# Patient Record
Sex: Male | Born: 1960
Health system: Southern US, Community
[De-identification: ages and names within clinical notes are randomized; demographics above are authoritative.]

## PROBLEM LIST (undated history)

## (undated) DIAGNOSIS — E785 Hyperlipidemia, unspecified: Secondary | ICD-10-CM

## (undated) DIAGNOSIS — L0390 Cellulitis, unspecified: Secondary | ICD-10-CM

## (undated) DIAGNOSIS — I10 Essential (primary) hypertension: Secondary | ICD-10-CM

## (undated) DIAGNOSIS — R531 Weakness: Secondary | ICD-10-CM

## (undated) DIAGNOSIS — M542 Cervicalgia: Secondary | ICD-10-CM

## (undated) DIAGNOSIS — E119 Type 2 diabetes mellitus without complications: Secondary | ICD-10-CM

## (undated) HISTORY — DX: Essential (primary) hypertension: I10

## (undated) HISTORY — DX: Cellulitis, unspecified: L03.90

## (undated) HISTORY — PX: NECK SURGERY: SHX720

## (undated) HISTORY — PX: COLONOSCOPY: SHX174

---

## 2011-07-08 ENCOUNTER — Ambulatory Visit (INDEPENDENT_AMBULATORY_CARE_PROVIDER_SITE_OTHER): Payer: BC Managed Care – PPO

## 2011-07-08 DIAGNOSIS — L219 Seborrheic dermatitis, unspecified: Secondary | ICD-10-CM

## 2011-07-16 ENCOUNTER — Ambulatory Visit (INDEPENDENT_AMBULATORY_CARE_PROVIDER_SITE_OTHER): Payer: BC Managed Care – PPO

## 2011-07-16 DIAGNOSIS — Z0000 Encounter for general adult medical examination without abnormal findings: Secondary | ICD-10-CM

## 2011-07-27 ENCOUNTER — Telehealth: Payer: Self-pay

## 2011-07-27 NOTE — Telephone Encounter (Signed)
Pt's wife reports that the cyst on the pts neck is starting to fill back up, getting larger again and feeling sore. It had greatly improved after round of Doxy, which is when he was in for CPE, but now that he has been off of it for awhile, infection is returning. She said the MD had originally said that pt may have to be on Abx for extended time, and requests another round of Abx.

## 2011-07-27 NOTE — Telephone Encounter (Signed)
.  [facility] CRYSTAL STATES HER HUSBAND HAVE BEEN SEEN SEVERAL TIMES FOR THE SAME THING AND WE NEED TO CALL IN SOME MORE ANTIBOTICS BECAUSE THEY DON'T WANT TO COME BACK FOR THE SAME THING. PLEASE CALL [phone] AND THEY USE [facility] ON [address]

## 2011-07-28 ENCOUNTER — Telehealth: Payer: Self-pay

## 2011-07-28 NOTE — Telephone Encounter (Signed)
Spoke with pt and advised i was calling in doxy #[phone]. He wants to know if we could call in at least 2 months worth. Please advise

## 2011-07-29 MED ORDER — DOXYCYCLINE HYCLATE 100 MG PO TABS: 100.0000 mg | ORAL_TABLET | Freq: Two times a day (BID) | ORAL | Status: AC

## 2011-07-29 NOTE — Telephone Encounter (Signed)
yes

## 2011-07-29 NOTE — Telephone Encounter (Signed)
LMOM notifying pt that supply of Doxy was called in.

## 2011-10-05 ENCOUNTER — Encounter: Payer: Self-pay | Admitting: Gastroenterology

## 2012-04-13 ENCOUNTER — Telehealth: Payer: Self-pay

## 2012-04-13 NOTE — Telephone Encounter (Signed)
Advised pt wife that pt must come in to be seen

## 2012-04-13 NOTE — Telephone Encounter (Signed)
Patient has infected boil on head again. Patient is "woozy" and "dizzy". Boil is on right side of head in his hair. Patient's wife is requesting that an antibiotic be called in because he has already been seen for this and she feels that "the ball was dropped" on our end. Patient leaves town tomorrow, 10/20, and his wife would like to be contacted asap. She can be reached at [phone].

## 2012-04-13 NOTE — Telephone Encounter (Signed)
Sorry we do not call in antibiotics for that. Patient would need to be evaluated.

## 2012-04-14 ENCOUNTER — Telehealth: Payer: Self-pay

## 2012-04-14 ENCOUNTER — Ambulatory Visit (INDEPENDENT_AMBULATORY_CARE_PROVIDER_SITE_OTHER): Payer: BC Managed Care – PPO | Admitting: Family Medicine

## 2012-04-14 VITALS — BP 120/76 | HR 106 | Temp 97.9°F | Resp 17 | Ht 73.0 in | Wt 172.0 lb

## 2012-04-14 DIAGNOSIS — R5381 Other malaise: Secondary | ICD-10-CM

## 2012-04-14 DIAGNOSIS — L03811 Cellulitis of head [any part, except face]: Secondary | ICD-10-CM

## 2012-04-14 DIAGNOSIS — L02831 Carbuncle of head [any part, except face]: Secondary | ICD-10-CM

## 2012-04-14 DIAGNOSIS — R42 Dizziness and giddiness: Secondary | ICD-10-CM

## 2012-04-14 DIAGNOSIS — R5383 Other fatigue: Secondary | ICD-10-CM

## 2012-04-14 DIAGNOSIS — L02828 Furuncle of other sites: Secondary | ICD-10-CM

## 2012-04-14 LAB — BASIC METABOLIC PANEL
BUN: 6 mg/dL (ref 6–23)
CO2: 27 mEq/L (ref 19–32)
Calcium: 9.4 mg/dL (ref 8.4–10.5)
Creat: 0.65 mg/dL (ref 0.50–1.35)
Glucose, Bld: 93 mg/dL (ref 70–99)
Sodium: 138 mEq/L (ref 135–145)

## 2012-04-14 LAB — POCT CBC
HCT, POC: 48 % (ref 43.5–53.7)
Hemoglobin: 15 g/dL (ref 14.1–18.1)
Lymph, poc: 2.3 (ref 0.6–3.4)
MCH, POC: 29.5 pg (ref 27–31.2)
MCHC: 31.3 g/dL — AB (ref 31.8–35.4)
MCV: 94.4 fL (ref 80–97)
POC Granulocyte: 3.7 (ref 2–6.9)
POC LYMPH PERCENT: 36.1 %L (ref 10–50)
RDW, POC: 14.3 %
WBC: 6.5 10*3/uL (ref 4.6–10.2)

## 2012-04-14 MED ORDER — MUPIROCIN CALCIUM 2 % NA OINT: TOPICAL_OINTMENT | Freq: Two times a day (BID) | NASAL | Status: DC

## 2012-04-14 MED ORDER — DOXYCYCLINE HYCLATE 100 MG PO TABS: 100.0000 mg | ORAL_TABLET | Freq: Two times a day (BID) | ORAL | Status: DC

## 2012-04-14 NOTE — Progress Notes (Signed)
   Patient ID: Malakii Harl MRN: [id number], DOB: 1961/06/03, 51 y.o. Date of Encounter: 04/14/2012, 10:50 AM    PROCEDURE NOTE: Verbal consent obtained. Betadine prep per usual protocol. Local anesthesia obtained with 1% lidocaine with epi 0.5 cc for local anesthesia of lesion along temporal scalp.   0.75 cm incision made with 11 blade along lesion.  Culture taken. Purulence expressed. Lesion explored revealing no loculations. Dressed. Wound care instructions including precautions with patient. Patient tolerated the procedure well.    Signed, Eula Listen, PA-C 04/14/2012 10:50 AM

## 2012-04-14 NOTE — Patient Instructions (Addendum)
Take antibiotic as prescribed and nasal ointment.  recheck if not improving or recurs - may need another eval with dermatology or surgeon.  Recheck in next 1 week for fatigue - sooner if worse, including any fever. Your should receive a call or letter about your lab results within the next week to 10 days.

## 2012-04-14 NOTE — Addendum Note (Signed)
Addended by: Neva Seat, Angelika Jerrett R on: 04/14/2012 11:10 AM   Modules accepted: Orders

## 2012-04-14 NOTE — Telephone Encounter (Signed)
Patient wife states he was seen today by Dr. Neva Seat and given Rx for skin ointment to be placed in the nose. They have concerns about the medication since the pharmacy changed the Rx from what the Dr ordered. The Dr ordered the nasal ointment  But they were given the original topical ointment and they need to know if its ok for that to be put in the nose because of the warnings on the medication. The patient had corneal surgery in the past so they are concerned. 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 Best # [phone].

## 2012-04-14 NOTE — Progress Notes (Signed)
Subjective:    Patient ID: Donald Rowland, male    DOB: 1960/08/08, 51 y.o.   MRN: [id number]  HPI Donald Rowland is a 51 y.o. male  Bumps on side of head and neck for few years, one is more sore today.  One of the other ones opened up last week and drained on its own. Feeling sluggish, weak for about a week. , dizzy, lightheaded past few days. No chest pains, no dark/tarry stools.  No fevers.   Hx of recurrent problems with boils - prior on doxycyline 100mg  bid for few months, last taken about a month ago.    Hx cornea transplant in 2010 - bumps since then.  Saw dermatologist for this in 2011 - told to see surgeon, but never had referral the, has not seen surgeon.   Review of Systems  Constitutional: Positive for fatigue. Negative for fever and chills.  Respiratory: Negative for chest tightness and shortness of breath.   Cardiovascular: Negative for chest pain and palpitations.  Gastrointestinal: Negative for blood in stool and anal bleeding.  Skin: Positive for rash.  Neurological: Positive for dizziness and light-headedness.       Objective:   Physical Exam  Constitutional: He is oriented to person, place, and time. He appears well-developed and well-nourished.  HENT:  Head: Atraumatic.    Right Ear: External ear normal.  Left Ear: External ear normal.  Eyes: EOM are normal. Pupils are equal, round, and reactive to light.  Cardiovascular: Normal rate, regular rhythm, normal heart sounds and intact distal pulses.   No extrasystoles are present.  No murmur heard. Pulmonary/Chest: Effort normal and breath sounds normal.  Neurological: He is alert and oriented to person, place, and time.  Skin: Skin is warm and dry.  Psychiatric: He has a normal mood and affect. His behavior is normal.     Results for orders placed in visit on 04/14/12  POCT CBC      Component Value Range   WBC 6.5  4.6 - 10.2 K/uL   Lymph, poc 2.3  0.6 - 3.4   POC LYMPH PERCENT 36.1  10 - 50 %L   MID  (cbc) 0.4  0 - 0.9   POC MID % 6.7  0 - 12 %M   POC Granulocyte 3.7  2 - 6.9   Granulocyte percent 57.2  37 - 80 %G   RBC 5.08  4.69 - 6.13 M/uL   Hemoglobin 15.0  14.1 - 18.1 g/dL   HCT, POC 11.9  14.7 - 53.7 %   MCV 94.4  80 - 97 fL   MCH, POC 29.5  27 - 31.2 pg   MCHC 31.3 (*) 31.8 - 35.4 g/dL   RDW, POC 82.9     Platelet Count, POC 134 (*) 142 - 424 K/uL   MPV 11.1  0 - 99.8 fL        Assessment & Plan:  Mohd. Derflinger is a 51 y.o. male . 1. Dizziness  POCT CBC, Basic metabolic panel  2. Malaise and fatigue  POCT CBC, Basic metabolic panel  3. Carbuncle of scalp  POCT CBC, Basic metabolic panel  4. Abscess or cellulitis of scalp  doxycycline (VIBRA-TABS) 100 MG tablet, mupirocin nasal ointment (BACTROBAN NASAL) 2 %    Carbuncle scalp - cellulitis - incised per procedure note. cx pending. Recurrent prior, with cx MSSA.  Doxycycline BID, and retreat with bactroban for possible MRSA.   Fatigue/malaise/dizziness. Cbc wnl, bmp wnl, fluids, relative rest and recheck in  next week if not improved, - rtc/er precautions discussed.  Patient Instructions  Take antibiotic as prescribed and nasal ointment.  recheck if not improving or recurs - may need another eval with dermatology or surgeon.  Recheck in next 1 week for fatigue - sooner if worse, including any fever. Your should receive a call or letter about your lab results within the next week to 10 days.

## 2012-04-15 NOTE — Telephone Encounter (Signed)
Is this ok?

## 2012-04-15 NOTE — Telephone Encounter (Signed)
Called patient he is advised.

## 2012-04-15 NOTE — Telephone Encounter (Signed)
That is fine 

## 2012-04-17 LAB — WOUND CULTURE: Gram Stain: NONE SEEN

## 2013-03-11 DIAGNOSIS — Z87891 Personal history of nicotine dependence: Secondary | ICD-10-CM | POA: Insufficient documentation

## 2013-04-07 DIAGNOSIS — Z947 Corneal transplant status: Secondary | ICD-10-CM | POA: Insufficient documentation

## 2013-04-28 LAB — HM COLONOSCOPY

## 2013-05-01 DIAGNOSIS — Z86010 Personal history of colonic polyps: Secondary | ICD-10-CM | POA: Insufficient documentation

## 2013-05-01 DIAGNOSIS — Z860101 Personal history of adenomatous and serrated colon polyps: Secondary | ICD-10-CM | POA: Insufficient documentation

## 2014-09-14 NOTE — Pre-Procedure Instructions (Signed)
Jeannine Bogaarnest Bayles  09/14/2014   Your procedure is scheduled on:  Thurs, Mar 31 @ 8:30 AM  [facility]  at 6:30 AM.  Call this number if you have problems the morning of surgery: [phone]   Remember:   Do not eat food or drink liquids after midnight.                 No Goody's,BC's,Aleve,Aspirin,Ibuprofen,Fish Oil,or any Herbal Medications.    Do not wear jewelry.  Do not wear lotions, powders, or colognes. You may wear deodorant.  Men may shave face and neck.  Do not bring valuables to the hospital.  [facility] is not responsible                  for any belongings or valuables.               Contacts, dentures or bridgework may not be worn into surgery.  Leave suitcase in the car. After surgery it may be brought to your room.  For patients admitted to the hospital, discharge time is determined by your                treatment team.               Patients discharged the day of surgery will not be allowed to drive  home.    Special Instructions:  [facility] - Preparing for Surgery  Before surgery, you can play an important role.  Because skin is not sterile, your skin needs to be as free of germs as possible.  You can reduce the number of germs on you skin by washing with CHG (chlorahexidine gluconate) soap before surgery.  CHG is an antiseptic cleaner which kills germs and bonds with the skin to continue killing germs even after washing.  Please DO NOT use if you have an allergy to CHG or antibacterial soaps.  If your skin becomes reddened/irritated stop using the CHG and inform your nurse when you arrive at Short Stay.  Do not shave (including legs and underarms) for at least 48 hours prior to the first CHG shower.  You may shave your face.  Please follow these instructions carefully:   1.  Shower with CHG Soap the night before surgery and the                                morning of Surgery.  2.  If you choose to wash your hair, wash your hair first as  usual with your       normal shampoo.  3.  After you shampoo, rinse your hair and body thoroughly to remove the                      Shampoo.  4.  Use CHG as you would any other liquid soap.  You can apply chg directly       to the skin and wash gently with scrungie or a clean washcloth.  5.  Apply the CHG Soap to your body ONLY FROM THE NECK DOWN.        Do not use on open wounds or open sores.  Avoid contact with your eyes,       ears, mouth and genitals (private parts).  Wash genitals (private parts)       with your normal soap.  6.  Wash thoroughly, paying  special attention to the area where your surgery        will be performed.  7.  Thoroughly rinse your body with warm water from the neck down.  8.  DO NOT shower/wash with your normal soap after using and rinsing off       the CHG Soap.  9.  Pat yourself dry with a clean towel.            10.  Wear clean pajamas.            11.  Place clean sheets on your bed the night of your first shower and do not        sleep with pets.  Day of Surgery  Do not apply any lotions/deoderants the morning of surgery.  Please wear clean clothes to the hospital/surgery center.     Please read over the following fact sheets that you were given: Pain Booklet, Coughing and Deep Breathing, MRSA Information and Surgical Site Infection Prevention

## 2014-09-15 ENCOUNTER — Encounter (HOSPITAL_COMMUNITY)
Admission: RE | Admit: 2014-09-15 | Discharge: 2014-09-15 | Disposition: A | Discharge status: Home / Self Care | Payer: Worker's Compensation | Source: Ambulatory Visit | Attending: Orthopedic Surgery | Admitting: Orthopedic Surgery

## 2014-09-15 ENCOUNTER — Encounter (HOSPITAL_COMMUNITY): Payer: Self-pay

## 2014-09-15 DIAGNOSIS — G959 Disease of spinal cord, unspecified: Secondary | ICD-10-CM | POA: Diagnosis not present

## 2014-09-15 DIAGNOSIS — M4802 Spinal stenosis, cervical region: Secondary | ICD-10-CM | POA: Insufficient documentation

## 2014-09-15 DIAGNOSIS — Z01812 Encounter for preprocedural laboratory examination: Secondary | ICD-10-CM | POA: Diagnosis present

## 2014-09-15 HISTORY — DX: Weakness: R53.1

## 2014-09-15 HISTORY — DX: Cervicalgia: M54.2

## 2014-09-15 LAB — BASIC METABOLIC PANEL
Anion gap: 9 (ref 5–15)
BUN: 6 mg/dL (ref 6–23)
CO2: 25 mmol/L (ref 19–32)
Calcium: 9.6 mg/dL (ref 8.4–10.5)
Chloride: 103 mmol/L (ref 96–112)
Creatinine, Ser: 0.91 mg/dL (ref 0.50–1.35)
GFR calc Af Amer: >90 mL/min (ref >90)
GLUCOSE: 111 mg/dL — AB (ref 70–99)
POTASSIUM: 3.7 mmol/L (ref 3.5–5.1)
Sodium: 137 mmol/L (ref 135–145)

## 2014-09-15 LAB — SURGICAL PCR SCREEN
MRSA, PCR: NEGATIVE
Staphylococcus aureus: NEGATIVE

## 2014-09-15 NOTE — Progress Notes (Signed)
Pt doesn't have a cardiologist  Dr.Painter is medical md  Denies EKG or CXR in past yr  Denies ever having an echo/stress test/heart cath

## 2014-09-19 DIAGNOSIS — J449 Chronic obstructive pulmonary disease, unspecified: Secondary | ICD-10-CM | POA: Insufficient documentation

## 2014-09-21 DIAGNOSIS — E785 Hyperlipidemia, unspecified: Secondary | ICD-10-CM | POA: Insufficient documentation

## 2014-09-22 MED ORDER — CEFAZOLIN SODIUM-DEXTROSE 2-3 GM-% IV SOLR
2.0000 g | INTRAVENOUS | Status: AC
Start: 2014-09-22 — End: 2014-09-23
  Administered 2014-09-23 (×2): 2 g via INTRAVENOUS
  Filled 2014-09-22: qty 50

## 2014-09-23 ENCOUNTER — Encounter (HOSPITAL_COMMUNITY)
Admission: RE | Disposition: A | Discharge status: Home / Self Care | Payer: Self-pay | Source: Ambulatory Visit | Attending: Orthopedic Surgery

## 2014-09-23 ENCOUNTER — Encounter (HOSPITAL_COMMUNITY): Payer: Self-pay | Admitting: *Deleted

## 2014-09-23 ENCOUNTER — Ambulatory Visit (HOSPITAL_COMMUNITY): Payer: Worker's Compensation | Admitting: Anesthesiology

## 2014-09-23 ENCOUNTER — Observation Stay (HOSPITAL_COMMUNITY)
Admission: RE | Admit: 2014-09-23 | Discharge: 2014-09-24 | Disposition: A | Discharge status: Home / Self Care | Payer: Worker's Compensation | Source: Ambulatory Visit | Attending: Orthopedic Surgery | Admitting: Orthopedic Surgery

## 2014-09-23 ENCOUNTER — Observation Stay (HOSPITAL_COMMUNITY): Payer: Worker's Compensation

## 2014-09-23 ENCOUNTER — Ambulatory Visit (HOSPITAL_COMMUNITY): Payer: Worker's Compensation

## 2014-09-23 DIAGNOSIS — M4712 Other spondylosis with myelopathy, cervical region: Secondary | ICD-10-CM | POA: Diagnosis not present

## 2014-09-23 DIAGNOSIS — F17210 Nicotine dependence, cigarettes, uncomplicated: Secondary | ICD-10-CM | POA: Diagnosis not present

## 2014-09-23 DIAGNOSIS — M542 Cervicalgia: Secondary | ICD-10-CM | POA: Diagnosis present

## 2014-09-23 DIAGNOSIS — Z981 Arthrodesis status: Secondary | ICD-10-CM

## 2014-09-23 DIAGNOSIS — G8929 Other chronic pain: Secondary | ICD-10-CM | POA: Diagnosis present

## 2014-09-23 DIAGNOSIS — Z419 Encounter for procedure for purposes other than remedying health state, unspecified: Secondary | ICD-10-CM

## 2014-09-23 HISTORY — PX: ANTERIOR CERVICAL DECOMP/DISCECTOMY FUSION: SHX1161

## 2014-09-23 SURGERY — ANTERIOR CERVICAL DECOMPRESSION/DISCECTOMY FUSION 1 LEVEL
Anesthesia: General | Site: Neck

## 2014-09-23 MED ORDER — DOCUSATE SODIUM 100 MG PO CAPS: 100.0000 mg | ORAL_CAPSULE | Freq: Three times a day (TID) | ORAL | Status: DC | PRN

## 2014-09-23 MED ORDER — ROCURONIUM BROMIDE 50 MG/5ML IV SOLN
INTRAVENOUS | Status: AC
  Filled 2014-09-23: qty 1

## 2014-09-23 MED ORDER — GLYCOPYRROLATE 0.2 MG/ML IJ SOLN
INTRAMUSCULAR | Status: AC
  Filled 2014-09-23: qty 4

## 2014-09-23 MED ORDER — ONDANSETRON HCL 4 MG/2ML IJ SOLN
INTRAMUSCULAR | Status: AC
  Filled 2014-09-23: qty 2

## 2014-09-23 MED ORDER — DEXTROSE 5 % IV SOLN
INTRAVENOUS | Status: DC | PRN
  Administered 2014-09-23 (×2): via INTRAVENOUS

## 2014-09-23 MED ORDER — MORPHINE SULFATE 2 MG/ML IJ SOLN: 1.0000 mg | INTRAMUSCULAR | Status: DC | PRN

## 2014-09-23 MED ORDER — NEOSTIGMINE METHYLSULFATE 10 MG/10ML IV SOLN
INTRAVENOUS | Status: DC | PRN
  Administered 2014-09-23 (×2): 2 mg via INTRAVENOUS

## 2014-09-23 MED ORDER — CEFAZOLIN SODIUM 1-5 GM-% IV SOLN
1.0000 g | Freq: Three times a day (TID) | INTRAVENOUS | Status: AC
  Administered 2014-09-23 – 2014-09-24 (×2): 1 g via INTRAVENOUS
  Filled 2014-09-23 (×3): qty 50

## 2014-09-23 MED ORDER — NEOSTIGMINE METHYLSULFATE 10 MG/10ML IV SOLN
INTRAVENOUS | Status: AC
  Filled 2014-09-23: qty 1

## 2014-09-23 MED ORDER — ACETAMINOPHEN 10 MG/ML IV SOLN
1000.0000 mg | Freq: Four times a day (QID) | INTRAVENOUS | Status: AC
  Administered 2014-09-23 – 2014-09-24 (×3): 1000 mg via INTRAVENOUS
  Filled 2014-09-23 (×3): qty 100

## 2014-09-23 MED ORDER — OXYCODONE HCL 5 MG/5ML PO SOLN: 5.0000 mg | Freq: Once | ORAL | Status: AC | PRN

## 2014-09-23 MED ORDER — ONDANSETRON HCL 4 MG PO TABS: 4.0000 mg | ORAL_TABLET | Freq: Three times a day (TID) | ORAL | Status: DC | PRN

## 2014-09-23 MED ORDER — FENTANYL CITRATE 0.05 MG/ML IJ SOLN
INTRAMUSCULAR | Status: DC | PRN
  Administered 2014-09-23 (×2): 100 ug via INTRAVENOUS
  Administered 2014-09-23 (×3): 50 ug via INTRAVENOUS

## 2014-09-23 MED ORDER — ARTIFICIAL TEARS OP OINT
TOPICAL_OINTMENT | OPHTHALMIC | Status: AC
  Filled 2014-09-23: qty 3.5

## 2014-09-23 MED ORDER — HEMOSTATIC AGENTS (NO CHARGE) OPTIME
TOPICAL | Status: DC | PRN
  Administered 2014-09-23: 1 via TOPICAL

## 2014-09-23 MED ORDER — MIDAZOLAM HCL 5 MG/5ML IJ SOLN
INTRAMUSCULAR | Status: DC | PRN
  Administered 2014-09-23 (×2): 1 mg via INTRAVENOUS

## 2014-09-23 MED ORDER — GLYCOPYRROLATE 0.2 MG/ML IJ SOLN
INTRAMUSCULAR | Status: DC | PRN
  Administered 2014-09-23 (×2): 0.3 mg via INTRAVENOUS

## 2014-09-23 MED ORDER — HYDROMORPHONE HCL 1 MG/ML IJ SOLN
0.2500 mg | INTRAMUSCULAR | Status: DC | PRN
  Administered 2014-09-23 (×2): 0.5 mg via INTRAVENOUS

## 2014-09-23 MED ORDER — PHENOL 1.4 % MT LIQD
1.0000 | OROMUCOSAL | Status: DC | PRN
Start: 2014-09-23 — End: 2014-09-24

## 2014-09-23 MED ORDER — DEXAMETHASONE SODIUM PHOSPHATE 10 MG/ML IJ SOLN
INTRAMUSCULAR | Status: AC
  Filled 2014-09-23: qty 1

## 2014-09-23 MED ORDER — OXYCODONE-ACETAMINOPHEN 10-325 MG PO TABS: 1.0000 | ORAL_TABLET | ORAL | Status: DC | PRN

## 2014-09-23 MED ORDER — ONDANSETRON HCL 4 MG/2ML IJ SOLN
INTRAMUSCULAR | Status: DC | PRN
  Administered 2014-09-23: 4 mg via INTRAVENOUS

## 2014-09-23 MED ORDER — DEXAMETHASONE 4 MG PO TABS
4.0000 mg | ORAL_TABLET | Freq: Four times a day (QID) | ORAL | Status: DC
  Administered 2014-09-23 – 2014-09-24 (×4): 4 mg via ORAL
  Filled 2014-09-23 (×4): qty 1

## 2014-09-23 MED ORDER — LACTATED RINGERS IV SOLN: INTRAVENOUS | Status: DC

## 2014-09-23 MED ORDER — SODIUM CHLORIDE 0.9 % IJ SOLN: 3.0000 mL | INTRAMUSCULAR | Status: DC | PRN

## 2014-09-23 MED ORDER — METHOCARBAMOL 500 MG PO TABS
500.0000 mg | ORAL_TABLET | Freq: Four times a day (QID) | ORAL | Status: DC | PRN
  Administered 2014-09-23 – 2014-09-24 (×2): 500 mg via ORAL
  Filled 2014-09-23 (×2): qty 1

## 2014-09-23 MED ORDER — ROCURONIUM BROMIDE 100 MG/10ML IV SOLN
INTRAVENOUS | Status: DC | PRN
  Administered 2014-09-23: 50 mg via INTRAVENOUS

## 2014-09-23 MED ORDER — LIDOCAINE HCL (CARDIAC) 20 MG/ML IV SOLN
INTRAVENOUS | Status: DC | PRN
  Administered 2014-09-23: 40 mg via INTRAVENOUS

## 2014-09-23 MED ORDER — MENTHOL 3 MG MT LOZG: 1.0000 | LOZENGE | OROMUCOSAL | Status: DC | PRN

## 2014-09-23 MED ORDER — PROPOFOL 10 MG/ML IV BOLUS
INTRAVENOUS | Status: AC
  Filled 2014-09-23: qty 20

## 2014-09-23 MED ORDER — OXYCODONE HCL 5 MG PO TABS
ORAL_TABLET | ORAL | Status: AC
  Filled 2014-09-23: qty 1

## 2014-09-23 MED ORDER — OXYCODONE HCL 5 MG PO TABS
5.0000 mg | ORAL_TABLET | Freq: Once | ORAL | Status: AC | PRN
  Administered 2014-09-23: 5 mg via ORAL

## 2014-09-23 MED ORDER — LACTATED RINGERS IV SOLN
INTRAVENOUS | Status: DC | PRN
  Administered 2014-09-23 (×3): via INTRAVENOUS

## 2014-09-23 MED ORDER — DEXAMETHASONE SODIUM PHOSPHATE 4 MG/ML IJ SOLN: 4.0000 mg | Freq: Four times a day (QID) | INTRAMUSCULAR | Status: DC

## 2014-09-23 MED ORDER — SODIUM CHLORIDE 0.9 % IJ SOLN: 3.0000 mL | Freq: Two times a day (BID) | INTRAMUSCULAR | Status: DC

## 2014-09-23 MED ORDER — OXYCODONE HCL 5 MG PO TABS
10.0000 mg | ORAL_TABLET | ORAL | Status: DC | PRN
  Administered 2014-09-23 – 2014-09-24 (×2): 10 mg via ORAL
  Filled 2014-09-23 (×2): qty 2

## 2014-09-23 MED ORDER — METHOCARBAMOL 500 MG PO TABS: 500.0000 mg | ORAL_TABLET | Freq: Three times a day (TID) | ORAL | Status: DC | PRN

## 2014-09-23 MED ORDER — HYDROMORPHONE HCL 1 MG/ML IJ SOLN
INTRAMUSCULAR | Status: AC
  Administered 2014-09-23: 0.5 mg via INTRAVENOUS
  Filled 2014-09-23: qty 1

## 2014-09-23 MED ORDER — BUPIVACAINE-EPINEPHRINE (PF) 0.25% -1:200000 IJ SOLN
INTRAMUSCULAR | Status: AC
  Filled 2014-09-23: qty 30

## 2014-09-23 MED ORDER — DEXTROSE 5 % IV SOLN
500.0000 mg | INTRAVENOUS | Status: AC
  Administered 2014-09-23: 500 mg via INTRAVENOUS
  Filled 2014-09-23: qty 5

## 2014-09-23 MED ORDER — PHENYLEPHRINE HCL 10 MG/ML IJ SOLN
10.0000 mg | INTRAVENOUS | Status: DC | PRN
  Administered 2014-09-23: 30 ug/min via INTRAVENOUS

## 2014-09-23 MED ORDER — ONDANSETRON HCL 4 MG/2ML IJ SOLN: 4.0000 mg | Freq: Once | INTRAMUSCULAR | Status: DC | PRN

## 2014-09-23 MED ORDER — DEXTROSE 5 % IV SOLN: 500.0000 mg | Freq: Four times a day (QID) | INTRAVENOUS | Status: DC | PRN

## 2014-09-23 MED ORDER — THROMBIN 20000 UNITS EX SOLR
CUTANEOUS | Status: DC | PRN
  Administered 2014-09-23: 20000 [IU] via TOPICAL

## 2014-09-23 MED ORDER — OXYCODONE HCL 5 MG PO TABS
ORAL_TABLET | ORAL | Status: AC
  Administered 2014-09-23: 5 mg via ORAL
  Filled 2014-09-23: qty 2

## 2014-09-23 MED ORDER — LIDOCAINE HCL (CARDIAC) 20 MG/ML IV SOLN
INTRAVENOUS | Status: AC
  Filled 2014-09-23: qty 5

## 2014-09-23 MED ORDER — 0.9 % SODIUM CHLORIDE (POUR BTL) OPTIME
TOPICAL | Status: DC | PRN
  Administered 2014-09-23: 1000 mL

## 2014-09-23 MED ORDER — ACETAMINOPHEN 10 MG/ML IV SOLN
1000.0000 mg | INTRAVENOUS | Status: AC
  Administered 2014-09-23: 1000 mg via INTRAVENOUS
  Filled 2014-09-23: qty 100

## 2014-09-23 MED ORDER — MIDAZOLAM HCL 2 MG/2ML IJ SOLN
INTRAMUSCULAR | Status: AC
  Filled 2014-09-23: qty 2

## 2014-09-23 MED ORDER — THROMBIN 20000 UNITS EX SOLR
CUTANEOUS | Status: AC
  Filled 2014-09-23: qty 20000

## 2014-09-23 MED ORDER — SODIUM CHLORIDE 0.9 % IV SOLN: 250.0000 mL | INTRAVENOUS | Status: DC

## 2014-09-23 MED ORDER — DEXAMETHASONE SODIUM PHOSPHATE 10 MG/ML IJ SOLN
INTRAMUSCULAR | Status: DC | PRN
  Administered 2014-09-23: 10 mg via INTRAVENOUS

## 2014-09-23 MED ORDER — FENTANYL CITRATE 0.05 MG/ML IJ SOLN
INTRAMUSCULAR | Status: AC
  Filled 2014-09-23: qty 5

## 2014-09-23 MED ORDER — BUPIVACAINE-EPINEPHRINE 0.25% -1:200000 IJ SOLN
INTRAMUSCULAR | Status: DC | PRN
  Administered 2014-09-23: 3 mL

## 2014-09-23 MED ORDER — ONDANSETRON HCL 4 MG/2ML IJ SOLN: 4.0000 mg | INTRAMUSCULAR | Status: DC | PRN

## 2014-09-23 MED ORDER — PROPOFOL 10 MG/ML IV BOLUS
INTRAVENOUS | Status: DC | PRN
  Administered 2014-09-23: 190 mg via INTRAVENOUS
  Administered 2014-09-23: 10 mg via INTRAVENOUS

## 2014-09-23 SURGICAL SUPPLY — 61 items
BIT DRILL 16 (BIT) ×2 IMPLANT
BLADE SURG ROTATE 9660 (MISCELLANEOUS) IMPLANT
BUR EGG ELITE 4.0 (BURR) IMPLANT
BUR MATCHSTICK NEURO 3.0 LAGG (BURR) IMPLANT
BUR MATCHSTICK NEURO 3.0X3.8 (BURR) ×2 IMPLANT
CANISTER SUCTION 2500CC (MISCELLANEOUS) ×2 IMPLANT
CLSR STERI-STRIP ANTIMIC 1/2X4 (GAUZE/BANDAGES/DRESSINGS) ×2 IMPLANT
CORDS BIPOLAR (ELECTRODE) ×2 IMPLANT
COVER SURGICAL LIGHT HANDLE (MISCELLANEOUS) ×4 IMPLANT
CRADLE DONUT ADULT HEAD (MISCELLANEOUS) ×2 IMPLANT
DRAPE C-ARM 42X72 X-RAY (DRAPES) ×2 IMPLANT
DRAPE POUCH INSTRU U-SHP 10X18 (DRAPES) ×2 IMPLANT
DRAPE SURG 17X23 STRL (DRAPES) ×2 IMPLANT
DRAPE U-SHAPE 47X51 STRL (DRAPES) ×2 IMPLANT
DRSG MEPILEX BORDER 4X4 (GAUZE/BANDAGES/DRESSINGS) ×2 IMPLANT
DURAPREP 26ML APPLICATOR (WOUND CARE) ×2 IMPLANT
ELECT COATED BLADE 2.86 ST (ELECTRODE) ×2 IMPLANT
ELECT PENCIL ROCKER SW 15FT (MISCELLANEOUS) ×2 IMPLANT
ELECT REM PT RETURN 9FT ADLT (ELECTROSURGICAL) ×2
ELECTRODE REM PT RTRN 9FT ADLT (ELECTROSURGICAL) ×1 IMPLANT
GLOVE BIOGEL PI IND STRL 8 (GLOVE) ×1 IMPLANT
GLOVE BIOGEL PI IND STRL 8.5 (GLOVE) ×1 IMPLANT
GLOVE BIOGEL PI INDICATOR 8 (GLOVE) ×1
GLOVE BIOGEL PI INDICATOR 8.5 (GLOVE) ×1
GLOVE ORTHO TXT STRL SZ7.5 (GLOVE) ×2 IMPLANT
GLOVE SS BIOGEL STRL SZ 8.5 (GLOVE) ×1 IMPLANT
GLOVE SUPERSENSE BIOGEL SZ 8.5 (GLOVE) ×1
GOWN STRL REUS W/ TWL XL LVL3 (GOWN DISPOSABLE) ×1 IMPLANT
GOWN STRL REUS W/TWL 2XL LVL3 (GOWN DISPOSABLE) ×4 IMPLANT
GOWN STRL REUS W/TWL XL LVL3 (GOWN DISPOSABLE) ×1
IMPLANT PEEK LORDOTIC LRG 8MM (Peek) ×2 IMPLANT
KIT BASIN OR (CUSTOM PROCEDURE TRAY) ×2 IMPLANT
KIT ROOM TURNOVER OR (KITS) ×2 IMPLANT
NEEDLE SPNL 18GX3.5 QUINCKE PK (NEEDLE) ×2 IMPLANT
NS IRRIG 1000ML POUR BTL (IV SOLUTION) ×2 IMPLANT
PACK ORTHO CERVICAL (CUSTOM PROCEDURE TRAY) ×2 IMPLANT
PACK UNIVERSAL I (CUSTOM PROCEDURE TRAY) ×2 IMPLANT
PAD ARMBOARD 7.5X6 YLW CONV (MISCELLANEOUS) ×4 IMPLANT
PATTIES SURGICAL .25X.25 (GAUZE/BANDAGES/DRESSINGS) IMPLANT
PIN RETAINER PRODISC 14 MM (PIN) ×4 IMPLANT
PUTTY BONE DBX 2.5 MIS (Bone Implant) ×2 IMPLANT
RESTRAINT LIMB HOLDER UNIV (RESTRAINTS) ×2 IMPLANT
SCREW SELF DRILLING 16MM (Screw) ×2 IMPLANT
SCREW ST PVA ZERO 18MM (Screw) ×2 IMPLANT
SPONGE INTESTINAL PEANUT (DISPOSABLE) ×2 IMPLANT
SPONGE SURGIFOAM ABS GEL 100 (HEMOSTASIS) ×2 IMPLANT
SURGIFLO TRUKIT (HEMOSTASIS) IMPLANT
SURGIFLO W/THROMBIN 8M KIT (HEMOSTASIS) ×4 IMPLANT
SUT BONE WAX W31G (SUTURE) ×2 IMPLANT
SUT MON AB 3-0 SH 27 (SUTURE) ×1
SUT MON AB 3-0 SH27 (SUTURE) ×1 IMPLANT
SUT SILK 2 0 (SUTURE)
SUT SILK 2-0 18XBRD TIE 12 (SUTURE) IMPLANT
SUT VIC AB 2-0 CT1 18 (SUTURE) ×2 IMPLANT
SYR BULB IRRIGATION 50ML (SYRINGE) ×2 IMPLANT
SYR CONTROL 10ML LL (SYRINGE) ×2 IMPLANT
TAPE CLOTH 4X10 WHT NS (GAUZE/BANDAGES/DRESSINGS) ×2 IMPLANT
TAPE UMBILICAL COTTON 1/8X30 (MISCELLANEOUS) ×2 IMPLANT
TOWEL OR 17X24 6PK STRL BLUE (TOWEL DISPOSABLE) ×2 IMPLANT
TOWEL OR 17X26 10 PK STRL BLUE (TOWEL DISPOSABLE) ×2 IMPLANT
WATER STERILE IRR 1000ML POUR (IV SOLUTION) ×2 IMPLANT

## 2014-09-23 NOTE — Brief Op Note (Signed)
09/23/2014  12:56 PM  PATIENT:  Donald Rowland  54 y.o. male  PRE-OPERATIVE DIAGNOSIS:  CERVICAL MYELOPATHY AND STENOSIS  POST-OPERATIVE DIAGNOSIS:  CERVICAL MYELOPATHY AND STENOSIS  PROCEDURE:  Procedure(s): ANTERIOR CERVICAL DECOMPRESSION/DISCECTOMY FUSION C4-C5 (N/A)  Removal of hardware C3/4 C5/6 Exploration of fusion C3/4 C5/6  SURGEON:  Surgeon(s) and Role:    * Venita Lickahari Dardan Shelton, MD - Primary  PHYSICIAN ASSISTANT:   ASSISTANTS: none   ANESTHESIA:   general  EBL:  Total I/O In: 2500 [I.V.:2500] Out: 20 [Blood:20]  BLOOD ADMINISTERED:none  DRAINS: none   LOCAL MEDICATIONS USED:  MARCAINE     SPECIMEN:  No Specimen  DISPOSITION OF SPECIMEN:  N/A  COUNTS:  YES  TOURNIQUET:  * No tourniquets in log *  DICTATION: .Other Dictation: Dictation Number [id number]  PLAN OF CARE: Admit for overnight observation  PATIENT DISPOSITION:  PACU - hemodynamically stable.

## 2014-09-23 NOTE — Anesthesia Preprocedure Evaluation (Addendum)
Anesthesia Evaluation  Patient identified by MRN, date of birth, ID band Patient awake    Reviewed: Allergy & Precautions, NPO status , Patient's Chart, lab work & pertinent test results  Airway Mallampati: II  TM Distance: >3 FB Neck ROM: Full    Dental  (+) Teeth Intact, Dental Advisory Given   Pulmonary Current Smoker,  breath sounds clear to auscultation        Cardiovascular Rhythm:Regular Rate:Normal     Neuro/Psych    GI/Hepatic   Endo/Other    Renal/GU      Musculoskeletal   Abdominal   Peds  Hematology   Anesthesia Other Findings Beard  Reproductive/Obstetrics                          Anesthesia Physical Anesthesia Plan  ASA: II  Anesthesia Plan: General   Post-op Pain Management:    Induction: Intravenous  Airway Management Planned: Oral ETT  Additional Equipment:   Intra-op Plan:   Post-operative Plan: Extubation in OR  Informed Consent: I have reviewed the patients History and Physical, chart, labs and discussed the procedure including the risks, benefits and alternatives for the proposed anesthesia with the patient or authorized representative who has indicated his/her understanding and acceptance.   Dental advisory given  Plan Discussed with: CRNA and Anesthesiologist  Anesthesia Plan Comments:         Anesthesia Quick Evaluation

## 2014-09-23 NOTE — Transfer of Care (Deleted)
Immediate Anesthesia Transfer of Care Note  Patient: Donald Rowland  Procedure(s) Performed: Procedure(s): ANTERIOR CERVICAL DECOMPRESSION/DISCECTOMY FUSION C4-C5, EXPLORATION OF FUSION C3-C4 C5-C6, REMOVAL OF HARDWARE C3-C4 C4-C5 (N/A)  Patient Location: PACU  Anesthesia Type:General  Level of Consciousness: awake and patient cooperative  Airway & Oxygen Therapy: Patient Spontanous Breathing and Patient connected to nasal cannula oxygen  Post-op Assessment: Report given to RN, Post -op Vital signs reviewed and stable and Patient moving all extremities  Post vital signs: Reviewed and stable  Last Vitals:  Filed Vitals:   09/23/14 0650  BP: 144/100  Pulse:   Temp:   Resp:     Complications: No apparent anesthesia complications

## 2014-09-23 NOTE — Progress Notes (Signed)
Pt arrived to pacu from OR in bed.  Pt easily arousable with aspen collar on.  Pt able to squeeze my fingers with both hands with moderate strength.  He could also wiggle his toes on both feet.  He denies pain, SOB and nausea.  When asked about sensation, he stated he had some numbness in both arms about the same as before surgery.  Dr. Shon BatonBrooks at bedside to assess patient.
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
## 2014-09-23 NOTE — Transfer of Care (Signed)
Immediate Anesthesia Transfer of Care Note  Patient: Donald Rowland  Procedure(s) Performed: Procedure(s): ANTERIOR CERVICAL DECOMPRESSION/DISCECTOMY FUSION C4-C5, EXPLORATION OF FUSION C3-C4 C5-C6, REMOVAL OF HARDWARE C3-C4 C4-C5 (N/A)  Patient Location: PACU  Anesthesia Type:General  Level of Consciousness: awake and patient cooperative  Airway & Oxygen Therapy: Patient Spontanous Breathing and Patient connected to nasal cannula oxygen  Post-op Assessment: Report given to RN, Post -op Vital signs reviewed and stable and Patient moving all extremities  Post vital signs: Reviewed and stable  Last Vitals:  Filed Vitals:   09/23/14 0650  BP: 144/100  Pulse:   Temp:   Resp:     Complications: No apparent anesthesia complications 

## 2014-09-23 NOTE — Anesthesia Postprocedure Evaluation (Signed)
  Anesthesia Post-op Note  Patient: Donald Rowland  Procedure(s) Performed: Procedure(s): ANTERIOR CERVICAL DECOMPRESSION/DISCECTOMY FUSION C4-C5, EXPLORATION OF FUSION C3-C4 C5-C6, REMOVAL OF HARDWARE C3-C4 C4-C5 (N/A)  Patient Location: PACU  Anesthesia Type:General  Level of Consciousness: awake, alert  and oriented  Airway and Oxygen Therapy: Patient Spontanous Breathing and Patient connected to nasal cannula oxygen  Post-op Pain: mild  Post-op Assessment: Post-op Vital signs reviewed, Patient's Cardiovascular Status Stable, Respiratory Function Stable, Patent Airway and Pain level controlled  Post-op Vital Signs: stable  Last Vitals:  Filed Vitals:   09/23/14 1527  BP: 140/89  Pulse: 101  Temp: 37.1 C  Resp: 16    Complications: No apparent anesthesia complications

## 2014-09-23 NOTE — Discharge Instructions (Signed)

## 2014-09-23 NOTE — Anesthesia Procedure Notes (Signed)
Procedure Name: Intubation Date/Time: 09/23/2014 8:36 AM Performed by: Marni GriffonJAMES, Aicia Babinski B Pre-anesthesia Checklist: Patient identified, Emergency Drugs available, Suction available and Patient being monitored Patient Re-evaluated:Patient Re-evaluated prior to inductionOxygen Delivery Method: Circle system utilized Preoxygenation: Pre-oxygenation with 100% oxygen Intubation Type: IV induction Ventilation: Mask ventilation without difficulty Laryngoscope Size: Mac and 4 Grade View: Grade III Tube type: Oral Tube size: 7.5 mm Number of attempts: 1 Airway Equipment and Method: Stylet Placement Confirmation: breath sounds checked- equal and bilateral and positive ETCO2 (saw base of cords only) Secured at: 23 (cm at teeth) cm Tube secured with: Tape Dental Injury: Teeth and Oropharynx as per pre-operative assessment

## 2014-09-24 ENCOUNTER — Encounter (HOSPITAL_COMMUNITY): Payer: Self-pay | Admitting: Orthopedic Surgery

## 2014-09-24 DIAGNOSIS — M4712 Other spondylosis with myelopathy, cervical region: Secondary | ICD-10-CM | POA: Diagnosis not present

## 2014-09-24 NOTE — Care Management Note (Signed)
CARE MANAGEMENT NOTE 09/24/2014  Patient:  Donald Rowland,Donald Rowland   Account Number:  [id number]  Date Initiated:  09/24/2014  Documentation initiated by:  Vance PeperBRADY,Dreon Pineda  Subjective/Objective Assessment:   54 yr old male admitted with cervical myelopathy and stenosis. Patient underwent  C4-5,5-6 ACDF.     Action/Plan:   Patient has no home health or DME needs. Case Manager signed off.   Anticipated DC Date:  09/24/2014   Anticipated DC Plan:  HOME/SELF CARE      DC Planning Services  CM consult      Choice offered to / List presented to:     DME arranged  NA        HH arranged  NA      Status of service:  Completed, signed off Medicare Important Message given?   (If response is "NO", the following Medicare IM given date fields will be blank) Date Medicare IM given:   Medicare IM given by:   Date Additional Medicare IM given:   Additional Medicare IM given by:    Discharge Disposition:  HOME/SELF CARE

## 2014-09-24 NOTE — Evaluation (Signed)
  Occupational Therapy Evaluation Patient Details Name: Donald Rowland MRN: [id number] DOB: 12/07/1960 Today's Date: 09/24/2014    History of Present Illness 54 yo M s/p ANTERIOR CERVICAL DECOMPRESSION/DISCECTOMY FUSION C4-C5, EXPLORATION OF FUSION C3-C4 C5-C6   Clinical Impression   Patient admitted with above. Patient independent PTA. Patient currently functioning at an overall independent to mod I level. D/C from acute OT services and no additional follow-up OT needs at this time. All appropriate education provided to patient and family. Please re-order OT if needed.      Follow Up Recommendations  No OT follow up;Supervision - Intermittent    Equipment Recommendations  None recommended by OT    Recommendations for Other Services  None at this time     Precautions / Restrictions Precautions Precautions: Cervical Precaution Comments: Reviewed cervical precautions Required Braces or Orthoses: Cervical Brace Cervical Brace: Hard collar;At all times Restrictions Weight Bearing Restrictions: No      Mobility Bed Mobility Overal bed mobility: Independent  Transfers Overall transfer level: Independent Equipment used: None   Balance Overall balance assessment: No apparent balance deficits (not formally assessed)    ADL Overall ADL's : Independent;Modified independent   General ADL Comments: Patient overall independent to mod I with functional tasks and transfers. No further OT needed at this time    Pertinent Vitals/Pain Pain Assessment: 0-10 Pain Score: 7  Pain Location: neck Pain Descriptors / Indicators: Aching;Constant Pain Intervention(s): Monitored during session     Hand Dominance Right   Extremity/Trunk Assessment Upper Extremity Assessment Upper Extremity Assessment: Overall WFL for tasks assessed (Pt with complaints of numbness and tingling in finger tips)   Lower Extremity Assessment Lower Extremity Assessment: Overall WFL for tasks assessed    Cervical / Trunk Assessment Cervical / Trunk Assessment: Normal   Communication Communication Communication: No difficulties   Cognition Arousal/Alertness: Awake/alert Behavior During Therapy: WFL for tasks assessed/performed Overall Cognitive Status: Within Functional Limits for tasks assessed              Home Living Family/patient expects to be discharged to:: Private residence Living Arrangements: Spouse/significant other Available Help at Discharge: Family;Available PRN/intermittently (wife plans to go back to work mon(4/4)) Type of Home: House Home Access: Stairs to enter Entergy CorporationEntrance Stairs-Number of Steps: 2-3 Entrance Stairs-Rails: None Home Layout: One level     Bathroom Shower/Tub: IT trainerTub/shower unit;Curtain   Bathroom Toilet: Standard     Home Equipment: Bedside commode;Tub bench          Prior Functioning/Environment Level of Independence: Independent             OT Diagnosis:  n/a, no acute OT needs   OT Problem List:   n/a, no acute OT needs   OT Treatment/Interventions:   n/a, no acute OT needs   OT Goals(Current goals can be found in the care plan section) Acute Rehab OT Goals Patient Stated Goal: "go home yesterday"  OT Frequency:   n/a, no acute OT needs  Barriers to D/C:  None known at this time          End of Session Equipment Utilized During Treatment: Cervical collar Nurse Communication: Mobility status  Activity Tolerance: Patient tolerated treatment well Patient left: in bed;with call bell/phone within reach;with family/visitor present   Time: [id number] OT Time Calculation (min): 17 min Charges:  OT General Charges $OT Visit: 1 Procedure OT Evaluation $Initial OT Evaluation Tier I: 1 Procedure  [name] , MS, OTR/L, CLT Pager: [phone]  09/24/2014, 11:07 AM

## 2014-09-24 NOTE — Progress Notes (Signed)
UR completed 
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
## 2014-09-24 NOTE — Progress Notes (Signed)
    Subjective: Procedure(s) (LRB): ANTERIOR CERVICAL DECOMPRESSION/DISCECTOMY FUSION C4-C5, EXPLORATION OF FUSION C3-C4 C5-C6, REMOVAL OF HARDWARE C3-C4 C4-C5 (N/A) 1 Day Post-Op  Patient reports pain as 1 on 0-10 scale.  Reports none arm pain reports incisional neck pain   Positive void Negative bowel movement Positive flatus Negative chest pain or shortness of breath  Objective: Vital signs in last 24 hours: Temp:  [97.9 F (36.6 C)-98.7 F (37.1 C)] 98.7 F (37.1 C) (03/31 1225) Pulse Rate:  [74-103] 74 (03/31 1225) Resp:  [13-19] 18 (03/31 1225) BP: (129-152)/(83-96) 141/96 mmHg (03/31 1225) SpO2:  [93 %-98 %] 98 % (03/31 1225)  Intake/Output from previous day: 03/30 0701 - 03/31 0700 In: 3060 [P.O.:360; I.V.:2700] Out: 820 [Urine:750; Blood:70]  Labs: No results for input(s): WBC, RBC, HCT, PLT in the last 72 hours. No results for input(s): NA, K, CL, CO2, BUN, CREATININE, GLUCOSE, CALCIUM in the last 72 hours. No results for input(s): LABPT, INR in the last 72 hours.  Physical Exam: Neurologically intact ABD soft Intact pulses distally Incision: dressing C/D/I and no drainage Compartment soft  Assessment/Plan: Patient stable  xrays satisfactory Mobilization with physical therapy Encourage incentive spirometry Continue care  Advance diet Up with therapy  Doing well - ok for d/c to home  Venita Lickahari Nirel Babler, MD [facility] [phone]

## 2014-09-24 NOTE — Discharge Summary (Signed)
Patient ID: Donald Rowland MRN: [id number] DOB/AGE: 24-Dec-1960 54 y.o.  Admit date: 09/23/2014 Discharge date: 09/24/2014  Admission Diagnoses:  Active Problems:   Neck pain   Discharge Diagnoses:  Active Problems:   Neck pain  status post Procedure(s): ANTERIOR CERVICAL DECOMPRESSION/DISCECTOMY FUSION C4-C5, EXPLORATION OF FUSION C3-C4 C5-C6, REMOVAL OF HARDWARE C3-C4 C4-C5  Past Medical History  Diagnosis Date  . Weakness     numbness and tingling to both hands and related to neck  . Neck pain     cord compression  . Medical history non-contributory     Surgeries: Procedure(s): ANTERIOR CERVICAL DECOMPRESSION/DISCECTOMY FUSION C4-C5, EXPLORATION OF FUSION C3-C4 C5-C6, REMOVAL OF HARDWARE C3-C4 C4-C5 on 09/23/2014   Consultants:    Discharged Condition: Improved  Hospital Course: Donald Rowland is an 54 y.o. male who was admitted 09/23/2014 for operative treatment of cervical myelopathy. Patient failed conservative treatments (please see the history and physical for the specifics) and had severe unremitting pain that affects sleep, daily activities and work/hobbies. After pre-op clearance, the patient was taken to the operating room on 09/23/2014 and underwent  Procedure(s): ANTERIOR CERVICAL DECOMPRESSION/DISCECTOMY FUSION C4-C5, EXPLORATION OF FUSION C3-C4 C5-C6, REMOVAL OF HARDWARE C3-C4 C4-C5.    Patient was given perioperative antibiotics: Anti-infectives    Start     Dose/Rate Route Frequency Ordered Stop   09/23/14 1600  ceFAZolin (ANCEF) IVPB 1 g/50 mL premix     1 g 100 mL/hr over 30 Minutes Intravenous Every 8 hours 09/23/14 1537 09/24/14 0218   09/22/14 1302  ceFAZolin (ANCEF) IVPB 2 g/50 mL premix     2 g 100 mL/hr over 30 Minutes Intravenous 30 min pre-op 09/22/14 1302 09/23/14 1236       Patient was given sequential compression devices and early ambulation to prevent DVT.   Patient benefited maximally from hospital stay and there were no complications.  At the time of discharge, the patient was urinating/moving their bowels without difficulty, tolerating a regular diet, pain is controlled with oral pain medications and they have been cleared by PT/OT.   Recent vital signs: Patient Vitals for the past 24 hrs:  BP Temp Temp src Pulse Resp SpO2  09/24/14 1225 (!) 141/96 mmHg 98.7 F (37.1 C) Oral 74 18 98 %  09/24/14 0443 137/83 mmHg 98 F (36.7 C) Oral 76 16 97 %  09/24/14 0032 (!) 129/94 mmHg 98.4 F (36.9 C) - 83 14 98 %  09/23/14 2012 (!) 152/91 mmHg 97.9 F (36.6 C) - 84 16 93 %  09/23/14 1527 140/89 mmHg 98.7 F (37.1 C) - (!) 101 16 95 %  09/23/14 1500 - 98.7 F (37.1 C) - (!) 103 - 96 %  09/23/14 1445 - - - 98 14 95 %  09/23/14 1430 138/88 mmHg - - 95 16 96 %  09/23/14 1415 133/88 mmHg - - (!) 103 17 97 %  09/23/14 1400 (!) 146/91 mmHg - - 98 13 96 %  09/23/14 1356 (!) 146/92 mmHg - - 99 18 95 %  09/23/14 1345 (!) 136/93 mmHg - - 93 19 95 %  09/23/14 1330 - - - 99 19 95 %     Recent laboratory studies: No results for input(s): WBC, HGB, HCT, PLT, NA, K, CL, CO2, BUN, CREATININE, GLUCOSE, INR, CALCIUM in the last 72 hours.  Invalid input(s): PT, 2   Discharge Medications:     Medication List    STOP taking these medications  doxycycline 100 MG tablet  Commonly known as:  VIBRA-TABS     mupirocin nasal ointment 2 %  Commonly known as:  BACTROBAN NASAL      TAKE these medications        docusate sodium 100 MG capsule  Commonly known as:  COLACE  Take 1 capsule (100 mg total) by mouth 3 (three) times daily as needed for mild constipation.     methocarbamol 500 MG tablet  Commonly known as:  ROBAXIN  Take 1 tablet (500 mg total) by mouth 3 (three) times daily as needed for muscle spasms.     ondansetron 4 MG tablet  Commonly known as:  ZOFRAN  Take 1 tablet (4 mg total) by mouth every 8 (eight) hours as needed for nausea or vomiting.     oxyCODONE-acetaminophen 10-325 MG per tablet  Commonly known as:   PERCOCET  Take 1 tablet by mouth every 4 (four) hours as needed for pain.        Diagnostic Studies: Dg Cervical Spine 2 Or 3 Views  09/23/2014   CLINICAL DATA:  Post ACDF  EXAM: CERVICAL SPINE - 2-3 VIEW  COMPARISON:  Fluoroscopy from earlier the same day  FINDINGS: Recent C4-5 anterior cervical discectomy with cage and ventral screws. There is no evidence of hardware complication or fracture. Remote discectomy and completed fusion across the C3-4 and C5-6 levels. Retained screw again noted at the level of C6. There is expected prevertebral gas and thickening.  IMPRESSION: No unexpected findings after C4-5 ACDF.   Electronically Signed   By: Marnee Spring M.D.   On: 09/23/2014 14:33   Dg Cervical Spine 2-3 Views  09/23/2014   CLINICAL DATA:  ANTERIOR CERVICAL DECOMPRESSION/DISCECTOMY FUSION C4-C5, EXPLORATION OF FUSION C3-C4 C5-C6, REMOVAL OF HARDWARE C3-C4 C4-C5, FLUORO TIME 26 SECONDS  EXAM: DG C-ARM 61-120 MIN; CERVICAL SPINE - 2-3 VIEW  FLUOROSCOPY TIME:  Radiation Exposure Index (as provided by the fluoroscopic device):  If the device does not provide the exposure index:  Fluoroscopy Time (in minutes and seconds):  26 seconds  Number of Acquired Images:  4  COMPARISON:  None  FINDINGS: Initial images show placement of a surgical probe superimposed over the the C4-C5 disc space. Subsequent images show a removal of the anterior fusion plates and screws at C3-C4 and C5-C6 with placement of an anterior fusion device at the C4-C5 level. New hardware is well-seated. There is a radiolucent spacer maintaining the C4-C5 disc height. Residual broken screws lie within the C6 vertebrae from the previous hardware.  IMPRESSION: Surgical imaging during C4-C5 anterior cervical spine fusion and removal of hardware as described.   Electronically Signed   By: Amie Portland M.D.   On: 09/23/2014 13:16   Dg C-arm 61-120 Min  09/23/2014   CLINICAL DATA:  ANTERIOR CERVICAL DECOMPRESSION/DISCECTOMY FUSION C4-C5,  EXPLORATION OF FUSION C3-C4 C5-C6, REMOVAL OF HARDWARE C3-C4 C4-C5, FLUORO TIME 26 SECONDS  EXAM: DG C-ARM 61-120 MIN; CERVICAL SPINE - 2-3 VIEW  FLUOROSCOPY TIME:  Radiation Exposure Index (as provided by the fluoroscopic device):  If the device does not provide the exposure index:  Fluoroscopy Time (in minutes and seconds):  26 seconds  Number of Acquired Images:  4  COMPARISON:  None  FINDINGS: Initial images show placement of a surgical probe superimposed over the the C4-C5 disc space. Subsequent images show a removal of the anterior fusion plates and screws at C3-C4 and C5-C6 with placement of an anterior fusion device at the C4-C5 level.  New hardware is well-seated. There is a radiolucent spacer maintaining the C4-C5 disc height. Residual broken screws lie within the C6 vertebrae from the previous hardware.  IMPRESSION: Surgical imaging during C4-C5 anterior cervical spine fusion and removal of hardware as described.   Electronically Signed   By: Amie Portlandavid  Ormond M.D.   On: 09/23/2014 13:16          Follow-up Information    Follow up with Alvy BealBROOKS,Jeslin Bazinet D, MD. Schedule an appointment as soon as possible for a visit in 2 weeks.   Specialty:  Orthopedic Surgery   Why:  For suture removal, For wound re-check   Contact information:   [address] [place] [address] [phone]       Discharge Plan:  discharge to home  Disposition: balance and ambulation improved.  C5 pain improved.  Doing well.  F/u 2 weeks    Signed: Venita LickBROOKS,Kimblery Diop D for Dr. Venita Lickahari Ameyah Bangura [facility] [phone] 09/24/2014, 1:21 PM

## 2015-11-25 ENCOUNTER — Telehealth (HOSPITAL_COMMUNITY): Payer: Self-pay | Admitting: *Deleted

## 2015-11-25 ENCOUNTER — Emergency Department (HOSPITAL_COMMUNITY)
Admission: EM | Admit: 2015-11-25 | Discharge: 2015-11-25 | Disposition: A | Discharge status: Home / Self Care | Payer: BLUE CROSS/BLUE SHIELD | Attending: Emergency Medicine | Admitting: Emergency Medicine

## 2015-11-25 ENCOUNTER — Emergency Department (HOSPITAL_COMMUNITY): Payer: BLUE CROSS/BLUE SHIELD

## 2015-11-25 ENCOUNTER — Other Ambulatory Visit: Payer: Self-pay | Admitting: Physician Assistant

## 2015-11-25 ENCOUNTER — Encounter (HOSPITAL_COMMUNITY): Payer: Self-pay

## 2015-11-25 DIAGNOSIS — Z79891 Long term (current) use of opiate analgesic: Secondary | ICD-10-CM | POA: Insufficient documentation

## 2015-11-25 DIAGNOSIS — R079 Chest pain, unspecified: Secondary | ICD-10-CM

## 2015-11-25 DIAGNOSIS — R0602 Shortness of breath: Secondary | ICD-10-CM | POA: Insufficient documentation

## 2015-11-25 DIAGNOSIS — Z79899 Other long term (current) drug therapy: Secondary | ICD-10-CM | POA: Insufficient documentation

## 2015-11-25 DIAGNOSIS — I10 Essential (primary) hypertension: Secondary | ICD-10-CM

## 2015-11-25 DIAGNOSIS — Z87891 Personal history of nicotine dependence: Secondary | ICD-10-CM | POA: Diagnosis not present

## 2015-11-25 LAB — I-STAT TROPONIN, ED
TROPONIN I, POC: 0 ng/mL (ref 0.00–0.08)
Troponin i, poc: 0 ng/mL (ref 0.00–0.08)

## 2015-11-25 LAB — COMPREHENSIVE METABOLIC PANEL
ALT: 23 U/L (ref 17–63)
ANION GAP: 9 (ref 5–15)
AST: 22 U/L (ref 15–41)
Albumin: 3.5 g/dL (ref 3.5–5.0)
Alkaline Phosphatase: 62 U/L (ref 38–126)
BUN: 6 mg/dL (ref 6–20)
CHLORIDE: 104 mmol/L (ref 101–111)
CO2: 23 mmol/L (ref 22–32)
Calcium: 9.2 mg/dL (ref 8.9–10.3)
Creatinine, Ser: 0.8 mg/dL (ref 0.61–1.24)
GFR calc Af Amer: >60 mL/min (ref >60)
GFR calc non Af Amer: >60 mL/min (ref >60)
GLUCOSE: 148 mg/dL — AB (ref 65–99)
POTASSIUM: 3.7 mmol/L (ref 3.5–5.1)
SODIUM: 136 mmol/L (ref 135–145)
Total Bilirubin: 0.4 mg/dL (ref 0.3–1.2)
Total Protein: 6.7 g/dL (ref 6.5–8.1)

## 2015-11-25 LAB — CBC
HCT: 42.2 % (ref 39.0–52.0)
HEMOGLOBIN: 13.9 g/dL (ref 13.0–17.0)
MCH: 28.4 pg (ref 26.0–34.0)
MCHC: 32.9 g/dL (ref 30.0–36.0)
MCV: 86.3 fL (ref 78.0–100.0)
Platelets: 141 10*3/uL — ABNORMAL LOW (ref 150–400)
RBC: 4.89 MIL/uL (ref 4.22–5.81)
RDW: 15.1 % (ref 11.5–15.5)
WBC: 7.6 10*3/uL (ref 4.0–10.5)

## 2015-11-25 LAB — LIPID PANEL
CHOL/HDL RATIO: 4.1 ratio
Cholesterol: 195 mg/dL (ref 0–200)
HDL: 47 mg/dL (ref >40)
LDL CALC: 130 mg/dL — AB (ref 0–99)
Triglycerides: 89 mg/dL (ref <150)
VLDL: 18 mg/dL (ref 0–40)

## 2015-11-25 LAB — D-DIMER, QUANTITATIVE (NOT AT ARMC): D DIMER QUANT: 0.3 ug{FEU}/mL (ref 0.00–0.50)

## 2015-11-25 MED ORDER — ASPIRIN 81 MG PO CHEW
324.0000 mg | CHEWABLE_TABLET | Freq: Once | ORAL | Status: AC
  Administered 2015-11-25: 324 mg via ORAL
  Filled 2015-11-25: qty 4

## 2015-11-25 MED ORDER — NITROGLYCERIN 0.4 MG SL SUBL
0.4000 mg | SUBLINGUAL_TABLET | SUBLINGUAL | Status: AC | PRN
  Administered 2015-11-25 (×3): 0.4 mg via SUBLINGUAL
  Filled 2015-11-25: qty 1

## 2015-11-25 NOTE — ED Notes (Signed)
EKG completed given to EDP.  

## 2015-11-25 NOTE — Discharge Instructions (Signed)
Nonspecific Chest Pain  °Chest pain can be caused by many different conditions. There is always a chance that your pain could be related to something serious, such as a heart attack or a blood clot in your lungs. Chest pain can also be caused by conditions that are not life-threatening. If you have chest pain, it is very important to follow up with your health care provider. °CAUSES  °Chest pain can be caused by: °· Heartburn. °· Pneumonia or bronchitis. °· Anxiety or stress. °· Inflammation around your heart (pericarditis) or lung (pleuritis or pleurisy). °· A blood clot in your lung. °· A collapsed lung (pneumothorax). It can develop suddenly on its own (spontaneous pneumothorax) or from trauma to the chest. °· Shingles infection (varicella-zoster virus). °· Heart attack. °· Damage to the bones, muscles, and cartilage that make up your chest wall. This can include: °¨ Bruised bones due to injury. °¨ Strained muscles or cartilage due to frequent or repeated coughing or overwork. °¨ Fracture to one or more ribs. °¨ Sore cartilage due to inflammation (costochondritis). °RISK FACTORS  °Risk factors for chest pain may include: °· Activities that increase your risk for trauma or injury to your chest. °· Respiratory infections or conditions that cause frequent coughing. °· Medical conditions or overeating that can cause heartburn. °· Heart disease or family history of heart disease. °· Conditions or health behaviors that increase your risk of developing a blood clot. °· Having had chicken pox (varicella zoster). °SIGNS AND SYMPTOMS °Chest pain can feel like: °· Burning or tingling on the surface of your chest or deep in your chest. °· Crushing, pressure, aching, or squeezing pain. °· Dull or sharp pain that is worse when you move, cough, or take a deep breath. °· Pain that is also felt in your back, neck, shoulder, or arm, or pain that spreads to any of these areas. °Your chest pain may come and go, or it may stay  constant. °DIAGNOSIS °Lab tests or other studies may be needed to find the cause of your pain. Your health care provider may have you take a test called an ambulatory ECG (electrocardiogram). An ECG records your heartbeat patterns at the time the test is performed. You may also have other tests, such as: °· Transthoracic echocardiogram (TTE). During echocardiography, sound waves are used to create a picture of all of the heart structures and to look at how blood flows through your heart. °· Transesophageal echocardiogram (TEE). This is a more advanced imaging test that obtains images from inside your body. It allows your health care provider to see your heart in finer detail. °· Cardiac monitoring. This allows your health care provider to monitor your heart rate and rhythm in real time. °· Holter monitor. This is a portable device that records your heartbeat and can help to diagnose abnormal heartbeats. It allows your health care provider to track your heart activity for several days, if needed. °· Stress tests. These can be done through exercise or by taking medicine that makes your heart beat more quickly. °· Blood tests. °· Imaging tests. °TREATMENT  °Your treatment depends on what is causing your chest pain. Treatment may include: °· Medicines. These may include: °¨ Acid blockers for heartburn. °¨ Anti-inflammatory medicine. °¨ Pain medicine for inflammatory conditions. °¨ Antibiotic medicine, if an infection is present. °¨ Medicines to dissolve blood clots. °¨ Medicines to treat coronary artery disease. °· Supportive care for conditions that do not require medicines. This may include: °¨ Resting. °¨ Applying heat   or cold packs to injured areas. °¨ Limiting activities until pain decreases. °HOME CARE INSTRUCTIONS °· If you were prescribed an antibiotic medicine, finish it all even if you start to feel better. °· Avoid any activities that bring on chest pain. °· Do not use any tobacco products, including  cigarettes, chewing tobacco, or electronic cigarettes. If you need help quitting, ask your health care provider. °· Do not drink alcohol. °· Take medicines only as directed by your health care provider. °· Keep all follow-up visits as directed by your health care provider. This is important. This includes any further testing if your chest pain does not go away. °· If heartburn is the cause for your chest pain, you may be told to keep your head raised (elevated) while sleeping. This reduces the chance that acid will go from your stomach into your esophagus. °· Make lifestyle changes as directed by your health care provider. These may include: °¨ Getting regular exercise. Ask your health care provider to suggest some activities that are safe for you. °¨ Eating a heart-healthy diet. A registered dietitian can help you to learn healthy eating options. °¨ Maintaining a healthy weight. °¨ Managing diabetes, if necessary. °¨ Reducing stress. °SEEK MEDICAL CARE IF: °· Your chest pain does not go away after treatment. °· You have a rash with blisters on your chest. °· You have a fever. °SEEK IMMEDIATE MEDICAL CARE IF:  °· Your chest pain is worse. °· You have an increasing cough, or you cough up blood. °· You have severe abdominal pain. °· You have severe weakness. °· You faint. °· You have chills. °· You have sudden, unexplained chest discomfort. °· You have sudden, unexplained discomfort in your arms, back, neck, or jaw. °· You have shortness of breath at any time. °· You suddenly start to sweat, or your skin gets clammy. °· You feel nauseous or you vomit. °· You suddenly feel light-headed or dizzy. °· Your heart begins to beat quickly, or it feels like it is skipping beats. °These symptoms may represent a serious problem that is an emergency. Do not wait to see if the symptoms will go away. Get medical help right away. Call your local emergency services (911 in the [place] Do not drive yourself to the hospital. °  °This  information is not intended to replace advice given to you by your health care provider. Make sure you discuss any questions you have with your health care provider. °  °Document Released: 03/22/2005 Document Revised: 07/03/2014 Document Reviewed: 01/16/2014 °Elsevier Interactive Patient Education ©2016 [employer] ° °

## 2015-11-25 NOTE — ED Notes (Signed)
Doctor at bedside.

## 2015-11-25 NOTE — ED Notes (Signed)
Spoke with patient and family member at bedside. Currently refusing aspirin and nitro. Explain what the medications are for and still refused. Doctor notified.

## 2015-11-25 NOTE — Consult Note (Signed)
Patient ID: Donald Bogaarnest Markman MRN: [id number], DOB/AGE: 55/08/1960   Admit date: 11/25/2015  Requesting physician: Dr. Fredderick PhenixBelfi Primary Physician: Willow OraANDY,CAMILLE L, MD Primary Cardiologist: New Reason for admission: Chest pain   Pt. Profile:  Donald Rowland is a 55 y.o. male with a noncontributory medical history who [facility] today with chest pain that woke him from sleep this morning.  The patient denies any past medical history and takes no medications. He follows with a primary care provider every year. He denies a history of hypertension, hyperlipidemia or diabetes. The last time he was seen by his medical doctor was in March 2016.  He was in his usual state of health until this morning when he was woken with left-sided chest pain.  This radiated into his left shoulder and was associated with diaphoresis and shortness of breath. It was worse with a deep breath in. The pain was constant and lasted until he arrived in the ER and was given sublingual nitroglycerin. He is now chest pain-free after 3 sublingual nitroglycerin. The patient denies any recent illnesses. No fevers or chills. He is very adamant about going home today and following up with his PCP with a stress test.  He does have a family history of CAD in his mother who had bypass surgery in her early 1860s. He previously smoked one half pack cigarettes for 10-15 years. No recent travel.    Problem List  Past Medical History  Diagnosis Date  . Weakness     numbness and tingling to both hands and related to neck  . Neck pain     cord compression  . Medical history non-contributory     Past Surgical History  Procedure Laterality Date  . Neck surgery    . Colonoscopy    . Anterior cervical decomp/discectomy fusion N/A 09/23/2014    Procedure: ANTERIOR CERVICAL DECOMPRESSION/DISCECTOMY FUSION C4-C5, EXPLORATION OF FUSION C3-C4 C5-C6, REMOVAL OF HARDWARE C3-C4 C4-C5;  Surgeon: Venita Lickahari Brooks, MD;  [facility];  Service: Orthopedics;  Laterality: N/A;     Allergies  No Known Allergies   Home Medications  Prior to Admission medications   Medication Sig Start Date End Date Taking? Authorizing Provider  docusate sodium (COLACE) 100 MG capsule Take 1 capsule (100 mg total) by mouth 3 (three) times daily as needed for mild constipation. 09/23/14   Venita Lickahari Brooks, MD  methocarbamol (ROBAXIN) 500 MG tablet Take 1 tablet (500 mg total) by mouth 3 (three) times daily as needed for muscle spasms. 09/23/14   Venita Lickahari Brooks, MD  ondansetron (ZOFRAN) 4 MG tablet Take 1 tablet (4 mg total) by mouth every 8 (eight) hours as needed for nausea or vomiting. 09/23/14   Venita Lickahari Brooks, MD  oxyCODONE-acetaminophen (PERCOCET) 10-325 MG per tablet Take 1 tablet by mouth every 4 (four) hours as needed for pain. 09/23/14   Venita Lickahari Brooks, MD    Family History  No family history on file. No family status information on file.     Social History  Social History   Social History  . Marital Status: Married    Spouse Name: N/A  . Number of Children: N/A  . Years of Education: N/A   Occupational History  . Not on file.   Social History Main Topics  . Smoking status: Former Smoker -- 0.50 packs/day for 15 years    Types: Cigarettes  . Smokeless tobacco: Never Used  . Alcohol Use: No  . Drug Use: No  .  Sexual Activity: Not on file   Other Topics Concern  . Not on file   Social History Narrative      All other systems reviewed and are otherwise negative except as noted above.  Physical Exam  Blood pressure 124/97, pulse 104, temperature 98.7 F (37.1 C), temperature source Oral, resp. rate 20, height 6' (1.829 m), weight 202 lb (91.627 kg), SpO2 97 %.  General: Pleasant, NAD Psych: Normal affect. Neuro: Alert and oriented X 3. Moves all extremities spontaneously. HEENT: Normal  Neck: Supple without bruits or JVD. Lungs:  Resp regular and unlabored, CTA. Heart: RRR no s3, s4, or  murmurs. Abdomen: Soft, non-tender, non-distended, BS + x 4.  Extremities: No clubbing, cyanosis or edema. DP/PT/Radials 2+ and equal bilaterally.  Labs  No results for input(s): CKTOTAL, CKMB, TROPONINI in the last 72 hours. Lab Results  Component Value Date   WBC 7.6 11/25/2015   HGB 13.9 11/25/2015   HCT 42.2 11/25/2015   MCV 86.3 11/25/2015   PLT 141* 11/25/2015    Recent Labs Lab 11/25/15 0945  NA 136  K 3.7  CL 104  CO2 23  BUN 6  CREATININE 0.80  CALCIUM 9.2  PROT 6.7  BILITOT 0.4  ALKPHOS 62  ALT 23  AST 22  GLUCOSE 148*    Radiology/Studies  Dg Chest Portable 1 View  11/25/2015  CLINICAL DATA:  Patient with chest pain and shortness of breath. EXAM: PORTABLE CHEST 1 VIEW COMPARISON:  Chest radiograph 07/20/2007. FINDINGS: Multiple monitoring leads overlie the patient. Stable cardiac and mediastinal contours. Interval development of bilateral mid and lower lung heterogeneous pulmonary opacities. Low lung volumes. No pleural effusion or pneumothorax. IMPRESSION: Bibasilar heterogeneous opacities which may represent atelectasis. Infection and/or edema not excluded. Electronically Signed   By: Annia Belt M.D.   On: 11/25/2015 11:14    ECG  Some concave up ST elevation in V2 through V5 as well as lead II and AVL that could be early repol   ASSESSMENT AND PLAN  Chest pain: typical and atypical features. The patient denies any exertional symptoms but it was relieved with SL NTG. It is also worse with deep inspiration. He does not have any risk factors for CAD but but his blood glucose is mildly elevated today and his diastolic pressures have been elevated as well. Also, mother had bypass surgery in her early 10s. I would favor keeping him overnight for observation and stress testing, but the patient is very adamant about leaving. Troponin 2 negative ( would think his enzymes would be positive after 6 hours of continuous pain.) ECG is abnormal, but could be due to early  repolarization and there is no previous for comparison. d-dimer negative. repeat chest x-ray with no acute abnormality and lipid panel with mildly elevated LDL (130). I have arrangned for outpatient ETT nuclear stress test on 11/30/15.  Probable hypertension: patient denies a history of this but diastolic blood pressures of been consistently elevated. Patient doesn't want to start any new medications at this time. He will follow up with PCP   Elevated blood glucose: hemoglobin A1c pending.   Signed, Cline Crock, PA-C 11/25/2015, 11:24 AM  Pager [phone]  Pt seen and examined  I agree with findings as noted by Philomena Course above Pt with CP today  New  Pain woke him  Pleuritic  Positional (worse with lying flat)  Some diaphoresis  Came to ER  Relieved with NTG  None now   NO old EKG  Today  EKG with SR with ST elevation ? Early repolarization Initial troponin neg  Repeat was negative   On exam  BP elevated  Lungs CTA  Cardiac RRR  No S3  No murmurs  No rub  Ext without edema  2_+ pulses.  Pain atypical  Some risk factors for CAD  (remote tobacco, mother with CAD)    OK to d/c on ASA  Stress test set up for next wk Pt would like to follow up with PCP for BP follow up.  Dietrich Pates

## 2015-11-25 NOTE — Telephone Encounter (Signed)
Attempted to call patient regarding upcoming appointment- no answer. Torris House J Barbette Mcglaun, RN 

## 2015-11-25 NOTE — ED Notes (Signed)
Pt arrives with c/o chest pain waking him at 0430 that radiates to left shoulder. Pt states SHOB and dizzy with sweating before shower. States chest pain at center chest now, sharp and [facility] and [facility]. No sweating. 3/10

## 2015-11-25 NOTE — ED Notes (Signed)
Patient agreed to take medication. Currently chest pain 4/10 achy sharp.

## 2015-11-25 NOTE — ED Provider Notes (Signed)
CSN: [id number]     Arrival date & time 11/25/15  0859 History   First MD Initiated Contact with Patient 11/25/15 [phone]     Chief Complaint  Patient presents with  . Chest Pain     (Consider location/radiation/quality/duration/timing/severity/associated sxs/prior Treatment) HPI Comments: Patient presents with chest pain. He has a history of hyperlipidemia. He has a family history of heart disease in that his mom had an MI in her 4460s. He reports chest pain that started at 4:30 this morning. It woke him up. HEENT pain in his left chest that radiates to his left shoulder. He states it got worse when he started walking around in the morning and got better when he would sit down and rest. He has associated shortness of breath and was diaphoretic with the pain earlier this morning. He denies any past history of heart disease. He states that on arrival to the ED the pain has improved.  He states at this point, the pain is almost gone and rates it as a 1-2 on a pain scale.  Patient is a 55 y.o. male presenting with chest pain.  Chest Pain Associated symptoms: diaphoresis and shortness of breath   Associated symptoms: no abdominal pain, no back pain, no cough, no dizziness, no fatigue, no fever, no headache, no nausea, no numbness, not vomiting and no weakness     Past Medical History  Diagnosis Date  . Weakness     numbness and tingling to both hands and related to neck  . Neck pain     cord compression  . Medical history non-contributory    Past Surgical History  Procedure Laterality Date  . Neck surgery    . Colonoscopy    . Anterior cervical decomp/discectomy fusion N/A 09/23/2014    Procedure: ANTERIOR CERVICAL DECOMPRESSION/DISCECTOMY FUSION C4-C5, EXPLORATION OF FUSION C3-C4 C5-C6, REMOVAL OF HARDWARE C3-C4 C4-C5;  Surgeon: Venita Lickahari Brooks, MD;  [facility];  Service: Orthopedics;  Laterality: N/A;   No family history on file. Social History  Substance Use Topics  . Smoking status:  Former Smoker -- 0.50 packs/day for 15 years    Types: Cigarettes  . Smokeless tobacco: Never Used  . Alcohol Use: No    Review of Systems  Constitutional: Positive for diaphoresis. Negative for fever, chills and fatigue.  HENT: Negative for congestion, rhinorrhea and sneezing.   Eyes: Negative.   Respiratory: Positive for shortness of breath. Negative for cough and chest tightness.   Cardiovascular: Positive for chest pain. Negative for leg swelling.  Gastrointestinal: Negative for nausea, vomiting, abdominal pain, diarrhea and blood in stool.  Genitourinary: Negative for frequency, hematuria, flank pain and difficulty urinating.  Musculoskeletal: Negative for back pain and arthralgias.  Skin: Negative for rash.  Neurological: Negative for dizziness, speech difficulty, weakness, numbness and headaches.      Allergies  Review of patient's allergies indicates no known allergies.  Home Medications   Prior to Admission medications   Medication Sig Start Date End Date Taking? Authorizing Provider  Multiple Vitamins-Minerals (MULTIVITAMIN WITH MINERALS) tablet Take 1 tablet by mouth daily.   Yes Historical Provider, MD  docusate sodium (COLACE) 100 MG capsule Take 1 capsule (100 mg total) by mouth 3 (three) times daily as needed for mild constipation. 09/23/14   Venita Lickahari Brooks, MD  methocarbamol (ROBAXIN) 500 MG tablet Take 1 tablet (500 mg total) by mouth 3 (three) times daily as needed for muscle spasms. 09/23/14   Venita Lickahari Brooks, MD  ondansetron (ZOFRAN) 4 MG tablet  Take 1 tablet (4 mg total) by mouth every 8 (eight) hours as needed for nausea or vomiting. 09/23/14   Venita Lick, MD  oxyCODONE-acetaminophen (PERCOCET) 10-325 MG per tablet Take 1 tablet by mouth every 4 (four) hours as needed for pain. 09/23/14   Venita Lick, MD   BP 134/98 mmHg  Pulse 89  Temp(Src) 98.7 F (37.1 C) (Oral)  Resp 16  Ht 6' (1.829 m)  Wt 202 lb (91.627 kg)  BMI 27.39 kg/m2  SpO2 100% Physical Exam   Constitutional: He is oriented to person, place, and time. He appears well-developed and well-nourished.  HENT:  Head: Normocephalic and atraumatic.  Eyes: Pupils are equal, round, and reactive to light.  Neck: Normal range of motion. Neck supple.  Cardiovascular: Normal rate, regular rhythm and normal heart sounds.   Pulmonary/Chest: Effort normal and breath sounds normal. No respiratory distress. He has no wheezes. He has no rales. He exhibits no tenderness.  Abdominal: Soft. Bowel sounds are normal. There is no tenderness. There is no rebound and no guarding.  Musculoskeletal: Normal range of motion. He exhibits no edema.  Lymphadenopathy:    He has no cervical adenopathy.  Neurological: He is alert and oriented to person, place, and time.  Skin: Skin is warm and dry. No rash noted.  Psychiatric: He has a normal mood and affect.    ED Course  Procedures (including critical care time) Labs Review Labs Reviewed  CBC - Abnormal; Notable for the following:    Platelets 141 (*)    All other components within normal limits  COMPREHENSIVE METABOLIC PANEL - Abnormal; Notable for the following:    Glucose, Bld 148 (*)    All other components within normal limits  LIPID PANEL - Abnormal; Notable for the following:    LDL Cholesterol 130 (*)    All other components within normal limits  D-DIMER, QUANTITATIVE (NOT AT [facility])  HEMOGLOBIN A1C  I-STAT TROPOININ, ED  Rosezena Sensor, ED    Imaging Review Dg Chest 2 View  11/25/2015  CLINICAL DATA:  Patient with chest tightness and shortness of breath. EXAM: CHEST  2 VIEW COMPARISON:  Chest radiograph 11/25/2015. FINDINGS: Normal cardiac and mediastinal contours. No consolidative pulmonary opacities. No pleural effusion pneumothorax. Thoracic spine degenerative changes. IMPRESSION: No active cardiopulmonary disease. Electronically Signed   By: Annia Belt M.D.   On: 11/25/2015 13:28   Dg Chest Portable 1 View  11/25/2015  CLINICAL DATA:   Patient with chest pain and shortness of breath. EXAM: PORTABLE CHEST 1 VIEW COMPARISON:  Chest radiograph 07/20/2007. FINDINGS: Multiple monitoring leads overlie the patient. Stable cardiac and mediastinal contours. Interval development of bilateral mid and lower lung heterogeneous pulmonary opacities. Low lung volumes. No pleural effusion or pneumothorax. IMPRESSION: Bibasilar heterogeneous opacities which may represent atelectasis. Infection and/or edema not excluded. Electronically Signed   By: Annia Belt M.D.   On: 11/25/2015 11:14   I have personally reviewed and evaluated these images and lab results as part of my medical decision-making.   EKG Interpretation   Date/Time:  Thursday November 25 2015 10:19:08 EDT Ventricular Rate:  84 PR Interval:  176 QRS Duration: 83 QT Interval:  346 QTC Calculation: 409 R Axis:   9 Text Interpretation:  Sinus rhythm Anterior infarct, possibly acute ST  elevation, consider inferior injury Lateral leads are also involved No old  tracing to compare Confirmed by Eulogio Requena  MD, Letonia Stead ([id number]) on 11/25/2015  10:28:50 AM Also confirmed by Jenah Vanasten  MD, Wen Munford ([id number]),  editor [place] CT,  Jola Babinski [phone])  on 11/25/2015 10:33:04 AM      MDM   Final diagnoses:  Chest pain, unspecified chest pain type    10:15 Patient initially was refusing aspirin and nitroglycerin. He states his pain was essentially gone. However when I was in the room talking to him, the pain returned. I did discuss the options with patient and he this point is willing to take the aspirin and nitroglycerin. A second EKG was performed which shows mild ST elevation anteriorly which is unchanged from prior EKG today. There is also 1 mm of ST elevation in lead 2. There is no reciprocal changes. I have paged the [facility] M.D. to evaluate the EKG.  11:15 I have paged the [facility] MD twice without response.  Spoke with Rosann Auerbach, the cardmaster who will send someone to see the pt  Patient has been seen by  cardiology. He does not want to be admitted for further cardiac evaluation. He was discharged home. He has been arranged to have an outpatient stress test on June 6. Patient was advised to make a follow-up appointment with his PCP. Return precautions were given.  Rolan Bucco, MD 11/25/15 [phone]

## 2015-11-26 LAB — HEMOGLOBIN A1C
HEMOGLOBIN A1C: 7.3 % — AB (ref 4.8–5.6)
MEAN PLASMA GLUCOSE: 163 mg/dL

## 2015-11-30 ENCOUNTER — Encounter (HOSPITAL_COMMUNITY): Payer: Self-pay

## 2016-02-09 ENCOUNTER — Telehealth: Payer: Self-pay | Admitting: *Deleted

## 2016-02-09 NOTE — Telephone Encounter (Signed)
We have call Mr.Schwegler several time to call us to schedule his test,we have had no response.I sent Cline CrockKathryn Thompson a message to see if this order could be removed and she said yes.

## 2017-05-28 DIAGNOSIS — L663 Perifolliculitis capitis abscedens: Secondary | ICD-10-CM | POA: Insufficient documentation

## 2017-06-01 ENCOUNTER — Encounter: Payer: Self-pay | Admitting: Physician Assistant

## 2017-06-01 ENCOUNTER — Ambulatory Visit (INDEPENDENT_AMBULATORY_CARE_PROVIDER_SITE_OTHER): Payer: BLUE CROSS/BLUE SHIELD | Admitting: Physician Assistant

## 2017-06-01 VITALS — BP 135/89 | HR 89 | Ht 72.0 in | Wt 196.0 lb

## 2017-06-01 DIAGNOSIS — M159 Polyosteoarthritis, unspecified: Secondary | ICD-10-CM | POA: Insufficient documentation

## 2017-06-01 DIAGNOSIS — G8929 Other chronic pain: Secondary | ICD-10-CM

## 2017-06-01 DIAGNOSIS — E782 Mixed hyperlipidemia: Secondary | ICD-10-CM | POA: Diagnosis not present

## 2017-06-01 DIAGNOSIS — M542 Cervicalgia: Secondary | ICD-10-CM

## 2017-06-01 DIAGNOSIS — Z7689 Persons encountering health services in other specified circumstances: Secondary | ICD-10-CM

## 2017-06-01 DIAGNOSIS — I10 Essential (primary) hypertension: Secondary | ICD-10-CM

## 2017-06-01 MED ORDER — ASPIRIN EC 81 MG PO TBEC: 81.0000 mg | DELAYED_RELEASE_TABLET | Freq: Every day | ORAL | 3 refills | Status: DC

## 2017-06-01 MED ORDER — LOSARTAN POTASSIUM 100 MG PO TABS: 100.0000 mg | ORAL_TABLET | Freq: Every day | ORAL | 2 refills | Status: DC

## 2017-06-01 NOTE — Patient Instructions (Addendum)
I have also ordered fasting labs. The lab is a walk-in open M-F 7:30a-4:30p (closed 12:30-1:30p). Nothing to eat or drink after midnight or at least 8 hours before your blood draw. You can have water and your medications.    For your blood pressure: - BP goal <= 130/80 - Start Losartan 100 mg daily - Start baby aspirin 81 mg to help prevent heart attack/stroke - Check blood pressure at home for the next 2 weeks and log your readings - Check around the same time each day in a relaxed setting - Limit salt to <2000 mg/day - Follow DASH eating plan - limit alcohol to 2 standard drinks per day - avoid tobacco products - weight loss: 7% of current body weight - Follow-up in 2 weeks   DASH Eating Plan DASH stands for "Dietary Approaches to Stop Hypertension." The DASH eating plan is a healthy eating plan that has been shown to reduce high blood pressure (hypertension). It may also reduce your risk for type 2 diabetes, heart disease, and stroke. The DASH eating plan may also help with weight loss. What are tips for following this plan? General guidelines  Avoid eating more than 2,300 mg (milligrams) of salt (sodium) a day. If you have hypertension, you may need to reduce your sodium intake to 1,500 mg a day.  Limit alcohol intake to no more than 1 drink a day for nonpregnant women and 2 drinks a day for men. One drink equals 12 oz of beer, 5 oz of wine, or 1 oz of hard liquor.  Work with your health care provider to maintain a healthy body weight or to lose weight. Ask what an ideal weight is for you.  Get at least 30 minutes of exercise that causes your heart to beat faster (aerobic exercise) most days of the week. Activities may include walking, swimming, or biking.  Work with your health care provider or diet and nutrition specialist (dietitian) to adjust your eating plan to your individual calorie needs. Reading food labels  Check food labels for the amount of sodium per serving. Choose  foods with less than 5 percent of the Daily Value of sodium. Generally, foods with less than 300 mg of sodium per serving fit into this eating plan.  To find whole grains, look for the word "whole" as the first word in the ingredient list. Shopping  Buy products labeled as "low-sodium" or "no salt added."  Buy fresh foods. Avoid canned foods and premade or frozen meals. Cooking  Avoid adding salt when cooking. Use salt-free seasonings or herbs instead of table salt or sea salt. Check with your health care provider or pharmacist before using salt substitutes.  Do not fry foods. Cook foods using healthy methods such as baking, boiling, grilling, and broiling instead.  Cook with heart-healthy oils, such as olive, canola, soybean, or sunflower oil. Meal planning   Eat a balanced diet that includes: ? 5 or more servings of fruits and vegetables each day. At each meal, try to fill half of your plate with fruits and vegetables. ? Up to 6-8 servings of whole grains each day. ? Less than 6 oz of lean meat, poultry, or fish each day. A 3-oz serving of meat is about the same size as a deck of cards. One egg equals 1 oz. ? 2 servings of low-fat dairy each day. ? A serving of nuts, seeds, or beans 5 times each week. ? Heart-healthy fats. Healthy fats called Omega-3 fatty acids are found in  foods such as flaxseeds and coldwater fish, like sardines, salmon, and mackerel.  Limit how much you eat of the following: ? Canned or prepackaged foods. ? Food that is high in trans fat, such as fried foods. ? Food that is high in saturated fat, such as fatty meat. ? Sweets, desserts, sugary drinks, and other foods with added sugar. ? Full-fat dairy products.  Do not salt foods before eating.  Try to eat at least 2 vegetarian meals each week.  Eat more home-cooked food and less restaurant, buffet, and fast food.  When eating at a restaurant, ask that your food be prepared with less salt or no salt, if  possible. What foods are recommended? The items listed may not be a complete list. Talk with your dietitian about what dietary choices are best for you. Grains Whole-grain or whole-wheat bread. Whole-grain or whole-wheat pasta. Brown rice. Orpah Cobb. Bulgur. Whole-grain and low-sodium cereals. Pita bread. Low-fat, low-sodium crackers. Whole-wheat flour tortillas. Vegetables Fresh or frozen vegetables (raw, steamed, roasted, or grilled). Low-sodium or reduced-sodium tomato and vegetable juice. Low-sodium or reduced-sodium tomato sauce and tomato paste. Low-sodium or reduced-sodium canned vegetables. Fruits All fresh, dried, or frozen fruit. Canned fruit in natural juice (without added sugar). Meat and other protein foods Skinless chicken or [place]. Ground chicken or [place]. Pork with fat trimmed off. Fish and seafood. Egg whites. Dried beans, peas, or lentils. Unsalted nuts, nut butters, and seeds. Unsalted canned beans. Lean cuts of beef with fat trimmed off. Low-sodium, lean deli meat. Dairy Low-fat (1%) or fat-free (skim) milk. Fat-free, low-fat, or reduced-fat cheeses. Nonfat, low-sodium ricotta or cottage cheese. Low-fat or nonfat yogurt. Low-fat, low-sodium cheese. Fats and oils Soft margarine without trans fats. Vegetable oil. Low-fat, reduced-fat, or light mayonnaise and salad dressings (reduced-sodium). Canola, safflower, olive, soybean, and sunflower oils. Avocado. Seasoning and other foods Herbs. Spices. Seasoning mixes without salt. Unsalted popcorn and pretzels. Fat-free sweets. What foods are not recommended? The items listed may not be a complete list. Talk with your dietitian about what dietary choices are best for you. Grains Baked goods made with fat, such as croissants, muffins, or some breads. Dry pasta or rice meal packs. Vegetables Creamed or fried vegetables. Vegetables in a cheese sauce. Regular canned vegetables (not low-sodium or reduced-sodium). Regular canned  tomato sauce and paste (not low-sodium or reduced-sodium). Regular tomato and vegetable juice (not low-sodium or reduced-sodium). Rosita Fire. Olives. Fruits Canned fruit in a light or heavy syrup. Fried fruit. Fruit in cream or butter sauce. Meat and other protein foods Fatty cuts of meat. Ribs. Fried meat. Tomasa Blase. Sausage. Bologna and other processed lunch meats. Salami. Fatback. Hotdogs. Bratwurst. Salted nuts and seeds. Canned beans with added salt. Canned or smoked fish. Whole eggs or egg yolks. Chicken or [place] with skin. Dairy Whole or 2% milk, cream, and half-and-half. Whole or full-fat cream cheese. Whole-fat or sweetened yogurt. Full-fat cheese. Nondairy creamers. Whipped toppings. Processed cheese and cheese spreads. Fats and oils Butter. Stick margarine. Lard. Shortening. Ghee. Bacon fat. Tropical oils, such as coconut, palm kernel, or palm oil. Seasoning and other foods Salted popcorn and pretzels. Onion salt, garlic salt, seasoned salt, table salt, and sea salt. Worcestershire sauce. Tartar sauce. Barbecue sauce. Teriyaki sauce. Soy sauce, including reduced-sodium. Steak sauce. Canned and packaged gravies. Fish sauce. Oyster sauce. Cocktail sauce. Horseradish that you find on the shelf. Ketchup. Mustard. Meat flavorings and tenderizers. Bouillon cubes. Hot sauce and Tabasco sauce. Premade or packaged marinades. Premade or packaged taco seasonings. Relishes. Regular  salad dressings. Where to find more information:  National Heart, Lung, and Blood Institute: PopSteam.iswww.nhlbi.nih.gov  American Heart Association: www.heart.org Summary  The DASH eating plan is a healthy eating plan that has been shown to reduce high blood pressure (hypertension). It may also reduce your risk for type 2 diabetes, heart disease, and stroke.  With the DASH eating plan, you should limit salt (sodium) intake to 2,300 mg a day. If you have hypertension, you may need to reduce your sodium intake to 1,500 mg a day.  When  on the DASH eating plan, aim to eat more fresh fruits and vegetables, whole grains, lean proteins, low-fat dairy, and heart-healthy fats.  Work with your health care provider or diet and nutrition specialist (dietitian) to adjust your eating plan to your individual calorie needs. This information is not intended to replace advice given to you by your health care provider. Make sure you discuss any questions you have with your health care provider. Document Released: 06/01/2011 Document Revised: 06/05/2016 Document Reviewed: 06/05/2016 Elsevier Interactive Patient Education  2017 [employer]

## 2017-06-01 NOTE — Progress Notes (Signed)
HPI:                                                                Donald Rowland is a 56 y.o. male who 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 Reports he has never been diagnosed with high blood pressure or taken medication. Denies vision change, headache, chest pain with exertion, orthopnea, lightheadedness, syncope and edema. He denies family history of hypertension or heart disease. Risk factors include: male sex, age>55  Past Medical History:  Diagnosis Date  . Cellulitis   . Hypertension   . Neck pain    cord compression  . Weakness    numbness and tingling to both hands and related to neck   Past Surgical History:  Procedure Laterality Date  . ANTERIOR CERVICAL DECOMP/DISCECTOMY FUSION N/A 09/23/2014   Procedure: ANTERIOR CERVICAL DECOMPRESSION/DISCECTOMY FUSION C4-C5, EXPLORATION OF FUSION C3-C4 C5-C6, REMOVAL OF HARDWARE C3-C4 C4-C5;  Surgeon: Venita Lickahari Brooks, MD;  [facility];  Service: Orthopedics;  Laterality: N/A;  . COLONOSCOPY    . NECK SURGERY     Social History   Tobacco Use  . Smoking status: Former Smoker    Packs/day: 0.50    Years: 15.00    Pack years: 7.50    Types: Cigarettes  . Smokeless tobacco: Never Used  Substance Use Topics  . Alcohol use: No   Family history is unknown by patient.  ROS: Review of Systems  Musculoskeletal: Positive for joint pain and neck pain.  Skin: Positive for rash (dissecting cellulitis of scalp).  All other systems reviewed and are negative.    Medications: Current Outpatient Medications  Medication Sig Dispense Refill  . ISOtretinoin (ACCUTANE) 40 MG capsule Take 2 capsules by mouth daily    . aspirin EC 81 MG tablet Take 1 tablet (81 mg total) by mouth daily. 90 tablet 3  . losartan (COZAAR) 100 MG tablet Take 1 tablet  (100 mg total) by mouth daily. 30 tablet 2  . Multiple Vitamins-Minerals (MULTIVITAMIN WITH MINERALS) tablet Take 1 tablet by mouth daily.     No current facility-administered medications for this visit.    No Known Allergies     Objective:  BP 135/89   Pulse 89   Ht 6' (1.829 m)   Wt 196 lb (88.9 kg)   BMI 26.58 kg/m   Gen:  alert, not ill-appearing, no distress, appropriate for age HEENT: head normocephalic without obvious abnormality, conjunctiva and cornea clear, trachea midline Pulm: Normal work of breathing, normal phonation, clear to auscultation bilaterally, no wheezes, rales or rhonchi CV: Normal rate, regular rhythm, s1 and s2 distinct, no murmurs, clicks or rubs, no carotid bruit  Neuro: alert and oriented x 3, no tremor MSK: extremities atraumatic, normal gait and station, no peripheral edema Skin: rash on scalp, well healed surgical scar of anterior lower neck, no cyanosis Psych: well-groomed, cooperative, good eye contact, euthymic mood, affect mood-congruent, speech is articulate, and thought processes clear and goal-directed  [facility] 06/01/2017  Decreased Interest 0  Down, Depressed, Hopeless 0  PHQ - 2 Score 0  No results found for this or any previous visit (from the past 72 hour(s)). No results found.    Assessment and Plan: 56 y.o. male with  1. Encounter to establish care - reviewed PMH, PSH, PFH, medications and allergies - reviewed health maintenance - colonoscopy Q5years, UTD 04/28/2013 - declines influenza vaccine - PHQ2 negative  2. Uncontrolled hypertension BP Readings from Last 3 Encounters:  06/01/17 135/89  11/25/15 (!) 132/101  09/24/14 (!) 141/96  - patient has had hypertensive readings as far back as 2016. Initial reading in the office today was stage 2. He has 2 cv risk factors, in addition to hypertension. Recommend starting ARB, in addition to therapeutic lifestyle changes and baby aspirin for primary  prevention - personally reviewed labs in Care Everywhere from 10/02/2016. Normal CBC, CMP, TSH, PSA. Lipid panel showing elevated LDL - recheck BP in 2 weeks nurse visit  3. Mixed Hyperlipidemia - LDL 144 (10/02/16) - 10 yr ASCVD risk 11.9%, candidate for statin therapy  4. Chronic neck pain - s/p cervical fusion. Follow-up with Sports Medicine   Patient education and anticipatory guidance given Patient agrees with treatment plan Follow-up in 2 weeks for BP check, then every 6 months for medication management or sooner as needed if symptoms worsen or fail to improve  Levonne Hubertharley E. Kijuan Gallicchio PA-C

## 2017-06-04 ENCOUNTER — Encounter: Payer: Self-pay | Admitting: Physician Assistant

## 2017-06-05 ENCOUNTER — Encounter: Payer: Self-pay | Admitting: Physician Assistant

## 2017-06-22 ENCOUNTER — Ambulatory Visit: Payer: Self-pay

## 2017-07-27 HISTORY — PX: INJECTION KNEE: SHX2446

## 2017-08-20 ENCOUNTER — Ambulatory Visit: Payer: BLUE CROSS/BLUE SHIELD | Admitting: Physician Assistant

## 2017-08-20 ENCOUNTER — Encounter: Payer: Self-pay | Admitting: Physician Assistant

## 2017-08-20 VITALS — BP 118/76 | HR 71 | Wt 197.0 lb

## 2017-08-20 DIAGNOSIS — Z1159 Encounter for screening for other viral diseases: Secondary | ICD-10-CM | POA: Diagnosis not present

## 2017-08-20 DIAGNOSIS — E785 Hyperlipidemia, unspecified: Secondary | ICD-10-CM | POA: Diagnosis not present

## 2017-08-20 DIAGNOSIS — Z5181 Encounter for therapeutic drug level monitoring: Secondary | ICD-10-CM | POA: Diagnosis not present

## 2017-08-20 DIAGNOSIS — I10 Essential (primary) hypertension: Secondary | ICD-10-CM | POA: Diagnosis not present

## 2017-08-20 DIAGNOSIS — M1711 Unilateral primary osteoarthritis, right knee: Secondary | ICD-10-CM | POA: Insufficient documentation

## 2017-08-20 MED ORDER — LOSARTAN POTASSIUM 100 MG PO TABS: 100.0000 mg | ORAL_TABLET | Freq: Every day | ORAL | 1 refills | Status: DC

## 2017-08-20 NOTE — Progress Notes (Signed)
HPI:                                                                Donald Rowland is a 57 y.o. male who presents to [facility]: Primary Care [facility] Medicine today for hypertension follow-up  HTN: taking Losartan 100 mg daily. Compliant with medications. Checks BP's at home. BP range 118-131/73-89. Denies vision change, headache, chest pain with exertion, orthopnea, lightheadedness, syncope and edema. Risk factors include: male sex, age >48, HLD  No concerns today.  Depression screen PHQ 2/9 06/01/2017  Decreased Interest 0  Down, Depressed, Hopeless 0  PHQ - 2 Score 0    No flowsheet data found.    Past Medical History:  Diagnosis Date  . Cellulitis   . Hypertension   . Neck pain    cord compression  . Weakness    numbness and tingling to both hands and related to neck   Past Surgical History:  Procedure Laterality Date  . ANTERIOR CERVICAL DECOMP/DISCECTOMY FUSION N/A 09/23/2014   Procedure: ANTERIOR CERVICAL DECOMPRESSION/DISCECTOMY FUSION C4-C5, EXPLORATION OF FUSION C3-C4 C5-C6, REMOVAL OF HARDWARE C3-C4 C4-C5;  Surgeon: Venita Lick, MD;  Location: [facility] OR;  Service: Orthopedics;  Laterality: N/A;  . COLONOSCOPY    . NECK SURGERY     Social History   Tobacco Use  . Smoking status: Former Smoker    Packs/day: 0.50    Years: 15.00    Pack years: 7.50    Types: Cigarettes  . Smokeless tobacco: Never Used  Substance Use Topics  . Alcohol use: No   Family history is unknown by patient.    ROS: negative except as noted in the HPI  Medications: Current Outpatient Medications  Medication Sig Dispense Refill  . aspirin EC 81 MG tablet Take 1 tablet (81 mg total) by mouth daily. 90 tablet 3  . ISOtretinoin (ACCUTANE) 40 MG capsule Take 2 capsules by mouth daily    . losartan (COZAAR) 100 MG tablet Take 1 tablet (100 mg total) by mouth daily. 30 tablet 2  . Multiple Vitamins-Minerals (MULTIVITAMIN WITH MINERALS) tablet Take 1 tablet by mouth  daily.     No current facility-administered medications for this visit.    No Known Allergies     Objective:  BP 118/76   Pulse 71   Wt 197 lb (89.4 kg)   BMI 26.72 kg/m  Gen:  alert, not ill-appearing, no distress, appropriate for age HEENT: head normocephalic without obvious abnormality, conjunctiva and cornea clear, trachea midline Pulm: Normal work of breathing, normal phonation, clear to auscultation bilaterally, no wheezes, rales or rhonchi CV: Normal rate, regular rhythm, s1 and s2 distinct, no murmurs, clicks or rubs; radial pulses 2+ symmetric, no carotid bruit Neuro: alert and oriented x 3, no tremor MSK: extremities atraumatic, normal gait and station Skin: intact, no rashes on exposed skin, no jaundice, no cyanosis Psych: well-groomed, cooperative, good eye contact, euthymic mood, affect mood-congruent, speech is articulate, and thought processes clear and goal-directed    No results found for this or any previous visit (from the past 72 hour(s)). No results found.    Assessment and Plan: 57 y.o. male with   1. Hypertension goal BP (blood pressure) < 130/80 BP Readings from Last 3 Encounters:  08/20/17  118/76  06/01/17 135/89  11/25/15 (!) 132/101  - BP in range in office today - continue Losartan daily - cont baby asa for primary prevention - counseled on therapeutic lifestyle changes - losartan (COZAAR) 100 MG tablet; Take 1 tablet (100 mg total) by mouth daily.  Dispense: 90 tablet; Refill: 1 - COMPLETE METABOLIC PANEL WITH GFR  2. Encounter for medication monitoring - due for fasting labs in 2 months - CBC - COMPLETE METABOLIC PANEL WITH GFR  3. Encounter for hepatitis C screening test for low risk patient - Hepatitis C antibody  4. Dyslipidemia, goal LDL below 100 - 10 yr ASCVD risk 11.9% - declines statin therapy - Lipid Panel w/reflex Direct LDL     Patient education and anticipatory guidance given Patient agrees with treatment  plan Follow-up in 2 months for fasting labs, 6 months for medication management or sooner as needed if symptoms worsen or fail to improve  Levonne Hubertharley E. Azilee Pirro PA-C

## 2017-08-20 NOTE — Patient Instructions (Addendum)
You are due for fasting labs in April. The lab is a walk-in open M-F 7:30a-4:30p (closed 12:30-1:30p). Nothing to eat or drink after midnight or at least 8 hours before your blood draw. You can have water and your medications.    For your blood pressure: - Goal <130/80 - continue your Losartan every day - continue baby aspirin 81 mg daily to help prevent heart attack/stroke - monitor and log blood pressures at home - check around the same time each day in a relaxed setting - Limit salt to <2000 mg/day - Follow DASH eating plan - limit alcohol to 2 standard drinks per day for men and 1 per day for women - avoid tobacco products - weight loss: 7% of current body weight - follow-up every 6 months for your blood pressure   Physical Activity Recommendations for modifying lipids and lowering blood pressure Engage in aerobic physical activity to reduce LDL-cholesterol, non-HDL-cholesterol, and blood pressure  Frequency: 3-4 sessions per week  Intensity: moderate to vigorous  Duration: 40 minutes on average  Physical Activity Recommendations for secondary prevention 1. Aerobic exercise  Frequency: 3-5 sessions per week  Intensity: 50-80% capacity  Duration: 20 - 60 minutes  Examples: walking, treadmill, cycling, rowing, stair climbing, and arm/leg ergometry  2. Resistance exercise  Frequency: 2-3 sessions per week  Intensity: 10-15 repetitions/set to moderate fatigue  Duration: 1-3 sets of 8-10 upper and lower body exercises  Examples: calisthenics, elastic bands, cuff/hand weights, dumbbels, free weights, wall pulleys, and weight machines  Heart-Healthy Lifestyle  Eating a diet rich in vegetables, fruits and whole grains: also includes low-fat dairy products, poultry, fish, legumes, and nuts; limit intake of sweets, sugar-sweetened beverages and red meats  Getting regular exercise  Maintaining a healthy weight  Not smoking or getting help quitting  Staying on top of  your health; for some people, lifestyle changes alone may not be enough to prevent a heart attack or stroke. In these cases, taking a statin at the right dose will most likely be necessary

## 2017-08-26 ENCOUNTER — Other Ambulatory Visit: Payer: Self-pay | Admitting: Physician Assistant

## 2017-08-26 DIAGNOSIS — I10 Essential (primary) hypertension: Secondary | ICD-10-CM

## 2017-10-25 ENCOUNTER — Encounter: Payer: Self-pay | Admitting: Physician Assistant

## 2017-11-12 ENCOUNTER — Encounter: Payer: Self-pay | Admitting: Physician Assistant

## 2018-06-03 LAB — HM COLONOSCOPY

## 2018-09-13 ENCOUNTER — Other Ambulatory Visit: Payer: Self-pay | Admitting: Physician Assistant

## 2018-09-13 DIAGNOSIS — I10 Essential (primary) hypertension: Secondary | ICD-10-CM

## 2019-05-30 NOTE — Progress Notes (Signed)
Received a fax from [place] that last office notes were needed. He was last seen in 2019. Last labs was from 2017. Faxed and received confirmation.

## 2020-03-05 ENCOUNTER — Ambulatory Visit (INDEPENDENT_AMBULATORY_CARE_PROVIDER_SITE_OTHER): Payer: Commercial Managed Care - PPO | Admitting: Family Medicine

## 2020-03-05 ENCOUNTER — Other Ambulatory Visit: Payer: Self-pay

## 2020-03-05 ENCOUNTER — Encounter: Payer: Self-pay | Admitting: Family Medicine

## 2020-03-05 VITALS — BP 138/88 | HR 76 | Temp 98.0°F | Ht 72.0 in | Wt 205.3 lb

## 2020-03-05 DIAGNOSIS — Z0000 Encounter for general adult medical examination without abnormal findings: Secondary | ICD-10-CM | POA: Diagnosis not present

## 2020-03-05 DIAGNOSIS — E785 Hyperlipidemia, unspecified: Secondary | ICD-10-CM

## 2020-03-05 DIAGNOSIS — Z125 Encounter for screening for malignant neoplasm of prostate: Secondary | ICD-10-CM | POA: Diagnosis not present

## 2020-03-05 DIAGNOSIS — I10 Essential (primary) hypertension: Secondary | ICD-10-CM

## 2020-03-05 MED ORDER — LOSARTAN POTASSIUM 100 MG PO TABS: 100.0000 mg | ORAL_TABLET | Freq: Every day | ORAL | 3 refills | Status: DC

## 2020-03-05 NOTE — Patient Instructions (Addendum)
Great to meet you today! Please continue losartan daily.  Have labs completed. See me again in about 6 months.     Preventive Care 13-59 Years Old, Male Preventive care refers to lifestyle choices and visits with your health care provider that can promote health and wellness. This includes:  A yearly physical exam. This is also called an annual well check.  Regular dental and eye exams.  Immunizations.  Screening for certain conditions.  Healthy lifestyle choices, such as eating a healthy diet, getting regular exercise, not using drugs or products that contain nicotine and tobacco, and limiting alcohol use. What can I expect for my preventive care visit? Physical exam Your health care provider will check:  Height and weight. These may be used to calculate body mass index (BMI), which is a measurement that tells if you are at a healthy weight.  Heart rate and blood pressure.  Your skin for abnormal spots. Counseling Your health care provider may ask you questions about:  Alcohol, tobacco, and drug use.  Emotional well-being.  Home and relationship well-being.  Sexual activity.  Eating habits.  Work and work Statistician. What immunizations do I need?  Influenza (flu) vaccine  This is recommended every year. Tetanus, diphtheria, and pertussis (Tdap) vaccine  You may need a Td booster every 10 years. Varicella (chickenpox) vaccine  You may need this vaccine if you have not already been vaccinated. Zoster (shingles) vaccine  You may need this after age 64. Measles, mumps, and rubella (MMR) vaccine  You may need at least one dose of MMR if you were born in 1957 or later. You may also need a second dose. Pneumococcal conjugate (PCV13) vaccine  You may need this if you have certain conditions and were not previously vaccinated. Pneumococcal polysaccharide (PPSV23) vaccine  You may need one or two doses if you smoke cigarettes or if you have certain  conditions. Meningococcal conjugate (MenACWY) vaccine  You may need this if you have certain conditions. Hepatitis A vaccine  You may need this if you have certain conditions or if you travel or work in places where you may be exposed to hepatitis A. Hepatitis B vaccine  You may need this if you have certain conditions or if you travel or work in places where you may be exposed to hepatitis B. Haemophilus influenzae type b (Hib) vaccine  You may need this if you have certain risk factors. Human papillomavirus (HPV) vaccine  If recommended by your health care provider, you may need three doses over 6 months. You may receive vaccines as individual doses or as more than one vaccine together in one shot (combination vaccines). Talk with your health care provider about the risks and benefits of combination vaccines. What tests do I need? Blood tests  Lipid and cholesterol levels. These may be checked every 5 years, or more frequently if you are over 62 years old.  Hepatitis C test.  Hepatitis B test. Screening  Lung cancer screening. You may have this screening every year starting at age 68 if you have a 30-pack-year history of smoking and currently smoke or have quit within the past 15 years.  Prostate cancer screening. Recommendations will vary depending on your family history and other risks.  Colorectal cancer screening. All adults should have this screening starting at age 11 and continuing until age 64. Your health care provider may recommend screening at age 69 if you are at increased risk. You will have tests every 1-10 years, depending on your  results and the type of screening test.  Diabetes screening. This is done by checking your blood sugar (glucose) after you have not eaten for a while (fasting). You may have this done every 1-3 years.  Sexually transmitted disease (STD) testing. Follow these instructions at home: Eating and drinking  Eat a diet that includes fresh  fruits and vegetables, whole grains, lean protein, and low-fat dairy products.  Take vitamin and mineral supplements as recommended by your health care provider.  Do not drink alcohol if your health care provider tells you not to drink.  If you drink alcohol: ? Limit how much you have to 0-2 drinks a day. ? Be aware of how much alcohol is in your drink. In the [place] one drink equals one 12 oz bottle of beer (355 mL), one 5 oz glass of wine (148 mL), or one 1 oz glass of hard liquor (44 mL). Lifestyle  Take daily care of your teeth and gums.  Stay active. Exercise for at least 30 minutes on 5 or more days each week.  Do not use any products that contain nicotine or tobacco, such as cigarettes, e-cigarettes, and chewing tobacco. If you need help quitting, ask your health care provider.  If you are sexually active, practice safe sex. Use a condom or other form of protection to prevent STIs (sexually transmitted infections).  Talk with your health care provider about taking a low-dose aspirin every day starting at age 76. What's next?  Go to your health care provider once a year for a well check visit.  Ask your health care provider how often you should have your eyes and teeth checked.  Stay up to date on all vaccines. This information is not intended to replace advice given to you by your health care provider. Make sure you discuss any questions you have with your health care provider. Document Revised: 06/06/2018 Document Reviewed: 06/06/2018 Elsevier Patient Education  2020 [employer].

## 2020-03-06 LAB — COMPLETE METABOLIC PANEL WITH GFR
AG Ratio: 1.5 (calc) (ref 1.0–2.5)
ALT: 18 U/L (ref 9–46)
AST: 17 U/L (ref 10–35)
Albumin: 4.1 g/dL (ref 3.6–5.1)
Alkaline phosphatase (APISO): 63 U/L (ref 35–144)
BUN: 8 mg/dL (ref 7–25)
CO2: 27 mmol/L (ref 20–32)
Calcium: 9 mg/dL (ref 8.6–10.3)
Chloride: 106 mmol/L (ref 98–110)
Creat: 0.87 mg/dL (ref 0.70–1.33)
GFR, Est African American: 109 mL/min/{1.73_m2} (ref >=60)
GFR, Est Non African American: 94 mL/min/{1.73_m2} (ref >=60)
Globulin: 2.7 g/dL (calc) (ref 1.9–3.7)
Glucose, Bld: 141 mg/dL — ABNORMAL HIGH (ref 65–139)
Potassium: 4 mmol/L (ref 3.5–5.3)
Sodium: 139 mmol/L (ref 135–146)
Total Bilirubin: 0.4 mg/dL (ref 0.2–1.2)
Total Protein: 6.8 g/dL (ref 6.1–8.1)

## 2020-03-06 LAB — CBC
HCT: 43.1 % (ref 38.5–50.0)
Hemoglobin: 14.3 g/dL (ref 13.2–17.1)
MCH: 29.2 pg (ref 27.0–33.0)
MCHC: 33.2 g/dL (ref 32.0–36.0)
MCV: 88 fL (ref 80.0–100.0)
MPV: 13.7 fL — ABNORMAL HIGH (ref 7.5–12.5)
Platelets: 145 10*3/uL (ref 140–400)
RBC: 4.9 10*6/uL (ref 4.20–5.80)
RDW: 13.8 % (ref 11.0–15.0)
WBC: 3.9 10*3/uL (ref 3.8–10.8)

## 2020-03-06 LAB — LIPID PANEL
Cholesterol: 201 mg/dL — ABNORMAL HIGH (ref <200)
HDL: 47 mg/dL (ref >=40)
LDL Cholesterol (Calc): 133 mg/dL (calc) — ABNORMAL HIGH
Non-HDL Cholesterol (Calc): 154 mg/dL (calc) — ABNORMAL HIGH (ref <130)
Total CHOL/HDL Ratio: 4.3 (calc) (ref <5.0)
Triglycerides: 100 mg/dL (ref <150)

## 2020-03-06 LAB — PSA: PSA: 2.2 ng/mL (ref <=4.0)

## 2020-03-06 LAB — HEMOGLOBIN A1C
Hgb A1c MFr Bld: 8.6 % of total Hgb — ABNORMAL HIGH (ref <5.7)
Mean Plasma Glucose: 200 (calc)
eAG (mmol/L): 11.1 (calc)

## 2020-03-07 ENCOUNTER — Encounter: Payer: Self-pay | Admitting: Family Medicine

## 2020-03-07 DIAGNOSIS — Z0000 Encounter for general adult medical examination without abnormal findings: Secondary | ICD-10-CM | POA: Insufficient documentation

## 2020-03-07 NOTE — Assessment & Plan Note (Addendum)
Well adult Orders Placed This Encounter  Procedures  . COMPLETE METABOLIC PANEL WITH GFR  . CBC  . Lipid Profile  . HgB A1c  . PSA  Screening: PSA.  Due for updated colonoscopy.  He will schedule  Immunizations: Declines flu vaccine.  Anticipatory guidance/Risk factor reduction:  Recommendations per AVS

## 2020-03-07 NOTE — Progress Notes (Signed)
Donald Rowland - 59 y.o. male MRN [id number]  Date of birth: Apr 20, 1961  Subjective Chief Complaint  Patient presents with  . Annual Exam    HPI Donald Rowland is a 59 y.o. male here today for annual exam.  He has a history of HTN, COPD, OA and dissecting cellulitis of the scalp.  He has seen dermatologist for scalp issues however symptoms continue to re-occur.  He does not wish to pursue further treatment at this time.    He would like to have updated labs completed.   His BP has remained well controlled with losartan.  He does need a refill of this.   He is a former smoker.  He consumes EtOH occasionally.   He does exercise occasionally.  Admits to poor diet, works as a Naval architect and often will just grab something out of convenience.   Review of Systems  Constitutional: Negative for chills, fever, malaise/fatigue and weight loss.  HENT: Negative for congestion, ear pain and sore throat.   Eyes: Negative for blurred vision, double vision and pain.  Respiratory: Negative for cough and shortness of breath.   Cardiovascular: Negative for chest pain and palpitations.  Gastrointestinal: Negative for abdominal pain, blood in stool, constipation, heartburn and nausea.  Genitourinary: Negative for dysuria and urgency.  Musculoskeletal: Negative for joint pain and myalgias.  Neurological: Negative for dizziness and headaches.  Endo/Heme/Allergies: Does not bruise/bleed easily.  Psychiatric/Behavioral: Negative for depression. The patient is not nervous/anxious and does not have insomnia.     No Known Allergies  Past Medical History:  Diagnosis Date  . Cellulitis   . Hypertension   . Neck pain    cord compression  . Weakness    numbness and tingling to both hands and related to neck    Past Surgical History:  Procedure Laterality Date  . ANTERIOR CERVICAL DECOMP/DISCECTOMY FUSION N/A 09/23/2014   Procedure: ANTERIOR CERVICAL DECOMPRESSION/DISCECTOMY FUSION C4-C5, EXPLORATION OF  FUSION C3-C4 C5-C6, REMOVAL OF HARDWARE C3-C4 C4-C5;  Surgeon: Venita Lick, MD;  Location: [facility] OR;  Service: Orthopedics;  Laterality: N/A;  . COLONOSCOPY    . INJECTION KNEE Right 07/2017  . NECK SURGERY      Social History   Socioeconomic History  . Marital status: Married    Spouse name: Not on file  . Number of children: Not on file  . Years of education: Not on file  . Highest education level: Not on file  Occupational History  . Not on file  Tobacco Use  . Smoking status: Former Smoker    Packs/day: 0.50    Years: 15.00    Pack years: 7.50    Types: Cigarettes  . Smokeless tobacco: Never Used  Substance and Sexual Activity  . Alcohol use: No  . Drug use: No  . Sexual activity: Never  Other Topics Concern  . Not on file  Social History Narrative  . Not on file   Social Determinants of Health   Financial Resource Strain:   . Difficulty of Paying Living Expenses: Not on file  Food Insecurity:   . Worried About Programme researcher, broadcasting/film/video in the Last Year: Not on file  . Ran Out of Food in the Last Year: Not on file  Transportation Needs:   . Lack of Transportation (Medical): Not on file  . Lack of Transportation (Non-Medical): Not on file  Physical Activity:   . Days of Exercise per Week: Not on file  . Minutes of Exercise per Session: Not on  file  Stress:   . Feeling of Stress : Not on file  Social Connections:   . Frequency of Communication with Friends and Family: Not on file  . Frequency of Social Gatherings with Friends and Family: Not on file  . Attends Religious Services: Not on file  . Active Member of Clubs or Organizations: Not on file  . Attends Banker Meetings: Not on file  . Marital Status: Not on file    Family History  Family history unknown: Yes    Health Maintenance  Topic Date Due  . Hepatitis C Screening  Never done  . HIV Screening  Never done  . COLONOSCOPY  04/28/2018  . INFLUENZA VACCINE  09/23/2020 (Originally  01/25/2020)  . TETANUS/TDAP  06/01/2024  . COVID-19 Vaccine  Completed     ----------------------------------------------------------------------------------------------------------------------------------------------------------------------------------------------------------------- Physical Exam BP 138/88   Pulse 76   Temp 98 F (36.7 C) (Temporal)   Ht 6' (1.829 m)   Wt 205 lb 4.8 oz (93.1 kg)   SpO2 97%   BMI 27.84 kg/m   Physical Exam Constitutional:      General: He is not in acute distress. HENT:     Head: Normocephalic and atraumatic.     Right Ear: External ear normal.     Left Ear: External ear normal.  Eyes:     General: No scleral icterus. Neck:     Thyroid: No thyromegaly.  Cardiovascular:     Rate and Rhythm: Normal rate and regular rhythm.     Heart sounds: Normal heart sounds.  Pulmonary:     Effort: Pulmonary effort is normal.     Breath sounds: Normal breath sounds.  Abdominal:     General: Bowel sounds are normal. There is no distension.     Palpations: Abdomen is soft.     Tenderness: There is no abdominal tenderness. There is no guarding.  Musculoskeletal:     Cervical back: Normal range of motion.  Lymphadenopathy:     Cervical: No cervical adenopathy.  Skin:    General: Skin is warm and dry.     Findings: No rash.  Neurological:     Mental Status: He is alert and oriented to person, place, and time.     Cranial Nerves: No cranial nerve deficit.     Motor: No abnormal muscle tone.  Psychiatric:        Mood and Affect: Mood normal.        Behavior: Behavior normal.     ------------------------------------------------------------------------------------------------------------------------------------------------------------------------------------------------------------------- Assessment and Plan  Well adult exam Well adult Orders Placed This Encounter  Procedures  . COMPLETE METABOLIC PANEL WITH GFR  . CBC  . Lipid Profile  . HgB  A1c  . PSA  Screening: PSA.  Due for updated colonoscopy.  He will schedule  Immunizations: Declines flu vaccine.  Anticipatory guidance/Risk factor reduction:  Recommendations per AVS   Meds ordered this encounter  Medications  . losartan (COZAAR) 100 MG tablet    Sig: Take 1 tablet (100 mg total) by mouth daily.    Dispense:  90 tablet    Refill:  3    No follow-ups on file.    This visit occurred during the SARS-CoV-2 public health emergency.  Safety protocols were in place, including screening questions prior to the visit, additional usage of staff PPE, and extensive cleaning of exam room while observing appropriate contact time as indicated for disinfecting solutions.

## 2020-04-06 ENCOUNTER — Encounter: Payer: Self-pay | Admitting: Family Medicine

## 2020-04-06 ENCOUNTER — Telehealth (INDEPENDENT_AMBULATORY_CARE_PROVIDER_SITE_OTHER): Payer: Commercial Managed Care - PPO | Admitting: Family Medicine

## 2020-04-06 DIAGNOSIS — E1165 Type 2 diabetes mellitus with hyperglycemia: Secondary | ICD-10-CM

## 2020-04-06 NOTE — Assessment & Plan Note (Signed)
Recommended addition of metformin.  He would like to continue to make lifestyle changes to better control blood sugars.  I discussed with him that it would be difficult for him to reach goal without medication but we agreed on a 3 month trial of this.  He agrees to consider medication if blood sugars are not significantly improving at that time he would try medication.

## 2020-04-06 NOTE — Progress Notes (Signed)
Donald Rowland - 59 y.o. male MRN [id number]  Date of birth: 1960-09-19   This visit type was conducted due to national recommendations for restrictions regarding the COVID-19 Pandemic (e.g. social distancing).  This format is felt to be most appropriate for this patient at this time.  All issues noted in this document were discussed and addressed.  No physical exam was performed (except for noted visual exam findings with Video Visits).  I discussed the limitations of evaluation and management by telemedicine and the availability of in person appointments. The patient expressed understanding and agreed to proceed.  I connected with@ on 04/06/20 at 11:30 AM EDT by a video enabled telemedicine application and verified that I am speaking with the correct person using two identifiers.   Interactive audio and video telecommunications were attempted between this provider and patient, however failed, due to patient having technical difficulties OR patient did not have access to video capability.  We continued and completed visit with audio only.   Present at visit: Everrett Coombe, DO Jeannine Boga   Patient Location: Home [address] [place] [address]   Provider location:   [facility]  Chief Complaint  Patient presents with  . diabetes management    HPI  Donald Rowland is a 59 y.o. male who presents via audio/video conferencing for a telehealth visit today.  The purpose of today's visit is to discuss abnormal labs, notably elevated a1c.  I reviewed previous lab findings with this and A1c has been elevated for several years.  He was unaware of diagnosis of diabetes.  Since being informed he has made significant changes to his diet including eliminating all sodas and cut back on starchy foods.   He has not really made changes to his activity level.  He has started checking blood glucose a couple times each day with readings typically in the 120's.  He does not want to start medication at this time.      ROS:  A comprehensive ROS was completed and negative except as noted per HPI  Past Medical History:  Diagnosis Date  . Cellulitis   . Hypertension   . Neck pain    cord compression  . Weakness    numbness and tingling to both hands and related to neck    Past Surgical History:  Procedure Laterality Date  . ANTERIOR CERVICAL DECOMP/DISCECTOMY FUSION N/A 09/23/2014   Procedure: ANTERIOR CERVICAL DECOMPRESSION/DISCECTOMY FUSION C4-C5, EXPLORATION OF FUSION C3-C4 C5-C6, REMOVAL OF HARDWARE C3-C4 C4-C5;  Surgeon: Venita Lick, MD;  Location: [facility] OR;  Service: Orthopedics;  Laterality: N/A;  . COLONOSCOPY    . INJECTION KNEE Right 07/2017  . NECK SURGERY      Family History  Family history unknown: Yes    Social History   Socioeconomic History  . Marital status: Married    Spouse name: Not on file  . Number of children: Not on file  . Years of education: Not on file  . Highest education level: Not on file  Occupational History  . Not on file  Tobacco Use  . Smoking status: Former Smoker    Packs/day: 0.50    Years: 15.00    Pack years: 7.50    Types: Cigarettes  . Smokeless tobacco: Never Used  Substance and Sexual Activity  . Alcohol use: No  . Drug use: No  . Sexual activity: Never  Other Topics Concern  . Not on file  Social History Narrative  . Not on file   Social Determinants  of Health   Financial Resource Strain:   . Difficulty of Paying Living Expenses: Not on file  Food Insecurity:   . Worried About Programme researcher, broadcasting/film/video in the Last Year: Not on file  . Ran Out of Food in the Last Year: Not on file  Transportation Needs:   . Lack of Transportation (Medical): Not on file  . Lack of Transportation (Non-Medical): Not on file  Physical Activity:   . Days of Exercise per Week: Not on file  . Minutes of Exercise per Session: Not on file  Stress:   . Feeling of Stress : Not on file  Social Connections:   . Frequency of Communication with Friends and  Family: Not on file  . Frequency of Social Gatherings with Friends and Family: Not on file  . Attends Religious Services: Not on file  . Active Member of Clubs or Organizations: Not on file  . Attends Banker Meetings: Not on file  . Marital Status: Not on file  Intimate Partner Violence:   . Fear of Current or Ex-Partner: Not on file  . Emotionally Abused: Not on file  . Physically Abused: Not on file  . Sexually Abused: Not on file     Current Outpatient Medications:  .  losartan (COZAAR) 100 MG tablet, Take 1 tablet (100 mg total) by mouth daily., Disp: 90 tablet, Rfl: 3 .  Multiple Vitamins-Minerals (MULTIVITAMIN WITH MINERALS) tablet, Take 2 tablets by mouth daily. , Disp: , Rfl:   EXAM:  VITALS per patient if applicable: Wt 205 lb (93 kg)   BMI 27.80 kg/m   GENERAL: alert, oriented, no acute distress  PSYCH/NEURO: pleasant and cooperative, no obvious depression or anxiety, speech and thought processing grossly intact  ASSESSMENT AND PLAN:  Discussed the following assessment and plan:  Type 2 diabetes mellitus with hyperglycemia (HCC) Recommended addition of metformin.  He would like to continue to make lifestyle changes to better control blood sugars.  I discussed with him that it would be difficult for him to reach goal without medication but we agreed on a 3 month trial of this.  He agrees to consider medication if blood sugars are not significantly improving at that time he would try medication.      I discussed the assessment and treatment plan with the patient. The patient was provided an opportunity to ask questions and all were answered. The patient agreed with the plan and demonstrated an understanding of the instructions.   The patient was advised to call back or seek an in-person evaluation if the symptoms worsen or if the condition fails to improve as anticipated.    Everrett Coombe, DO

## 2020-04-06 NOTE — Progress Notes (Signed)
Attempted to contact Donald Rowland for pre-visit. No answer. LVM for callback.

## 2020-04-13 ENCOUNTER — Other Ambulatory Visit: Payer: Self-pay

## 2020-04-13 DIAGNOSIS — E1165 Type 2 diabetes mellitus with hyperglycemia: Secondary | ICD-10-CM

## 2020-04-13 MED ORDER — METFORMIN HCL 500 MG PO TABS: ORAL_TABLET | ORAL | 3 refills | Status: DC

## 2020-04-13 NOTE — Telephone Encounter (Signed)
Patient called stating he is ready to start taking Metformin.  Please place the order.

## 2020-10-07 ENCOUNTER — Telehealth: Payer: Self-pay

## 2020-10-07 NOTE — Telephone Encounter (Signed)
Pt lvm requesting annual appointment scheduling.   Please contact.

## 2020-10-07 NOTE — Telephone Encounter (Signed)
LVM for patient to call back to get appointment scheduled. ANM

## 2020-10-20 ENCOUNTER — Telehealth: Payer: Self-pay

## 2020-10-20 NOTE — Telephone Encounter (Signed)
Patient would like to schedule his annual appointment for 11/01/2020 in the morning

## 2020-10-20 NOTE — Telephone Encounter (Signed)
Patient last had a physical on 03/05/2020. Called patient and let him know most insurance companies cover physicals 365 days PLUS one, so he would either need to scheduled physical after 03/05/2021 or check with insurance company to see if they will cover a physical before the year date. Am

## 2020-12-09 ENCOUNTER — Other Ambulatory Visit: Payer: Self-pay | Admitting: Osteopathic Medicine

## 2020-12-09 DIAGNOSIS — E1165 Type 2 diabetes mellitus with hyperglycemia: Secondary | ICD-10-CM

## 2020-12-09 NOTE — Telephone Encounter (Signed)
Please call patient and schedule appt for diabetic follow-up.   Sending 30- day refill. No additional without appt and labs. (Fasting).   Thanks

## 2020-12-10 NOTE — Telephone Encounter (Signed)
Patient has been scheduled for diabetic follow up for 12/23/20. AM

## 2020-12-17 ENCOUNTER — Other Ambulatory Visit: Payer: Self-pay

## 2020-12-17 DIAGNOSIS — I10 Essential (primary) hypertension: Secondary | ICD-10-CM

## 2020-12-17 MED ORDER — LOSARTAN POTASSIUM 100 MG PO TABS: 100.0000 mg | ORAL_TABLET | Freq: Every day | ORAL | 0 refills | Status: DC

## 2020-12-23 ENCOUNTER — Ambulatory Visit: Payer: Commercial Managed Care - PPO | Admitting: Family Medicine

## 2021-01-27 ENCOUNTER — Telehealth: Payer: Self-pay

## 2021-01-27 NOTE — Telephone Encounter (Signed)
Please call to schedule patient's appt.   Wanting 02/14/21   Thanks

## 2021-01-28 NOTE — Telephone Encounter (Signed)
Appointment made

## 2021-03-14 ENCOUNTER — Encounter: Payer: Self-pay | Admitting: Family Medicine

## 2021-03-14 ENCOUNTER — Ambulatory Visit (INDEPENDENT_AMBULATORY_CARE_PROVIDER_SITE_OTHER): Payer: Commercial Managed Care - PPO | Admitting: Family Medicine

## 2021-03-14 ENCOUNTER — Other Ambulatory Visit: Payer: Self-pay

## 2021-03-14 VITALS — BP 129/80 | HR 88 | Temp 97.7°F | Ht 72.0 in | Wt 197.0 lb

## 2021-03-14 DIAGNOSIS — E119 Type 2 diabetes mellitus without complications: Secondary | ICD-10-CM | POA: Insufficient documentation

## 2021-03-14 DIAGNOSIS — E785 Hyperlipidemia, unspecified: Secondary | ICD-10-CM | POA: Diagnosis not present

## 2021-03-14 DIAGNOSIS — I10 Essential (primary) hypertension: Secondary | ICD-10-CM

## 2021-03-14 DIAGNOSIS — E1165 Type 2 diabetes mellitus with hyperglycemia: Secondary | ICD-10-CM

## 2021-03-14 DIAGNOSIS — Z125 Encounter for screening for malignant neoplasm of prostate: Secondary | ICD-10-CM

## 2021-03-14 DIAGNOSIS — Z0000 Encounter for general adult medical examination without abnormal findings: Secondary | ICD-10-CM

## 2021-03-14 DIAGNOSIS — R972 Elevated prostate specific antigen [PSA]: Secondary | ICD-10-CM | POA: Insufficient documentation

## 2021-03-14 LAB — PSA: PSA: 3.16 ng/mL (ref <=4.00)

## 2021-03-14 MED ORDER — METFORMIN HCL 500 MG PO TABS: ORAL_TABLET | ORAL | 2 refills | Status: AC

## 2021-03-14 MED ORDER — LOSARTAN POTASSIUM 100 MG PO TABS: 100.0000 mg | ORAL_TABLET | Freq: Every day | ORAL | 2 refills | Status: AC

## 2021-03-14 NOTE — Progress Notes (Signed)
Donald Rowland - 60 y.o. male MRN [id number]  Date of birth: 31-Oct-1960  Subjective Chief Complaint  Patient presents with   Diabetes    HPI Donald Rowland is a 60 year old male here today for annual exam.  He has history of type 2 diabetes, hypertension, hyperlipidemia and COPD.  He works as a Naval architect.  He does not have any additional concerns at this time.  He does need renewal of medications.  He is a former smoker.  He denies alcohol use.  He does exercise occasionally.  He feels like diet is fairly good when he is at home but could be improved while he is on the road. Review of Systems  Constitutional:  Negative for chills, fever, malaise/fatigue and weight loss.  HENT:  Negative for congestion, ear pain and sore throat.   Eyes:  Negative for blurred vision, double vision and pain.  Respiratory:  Negative for cough and shortness of breath.   Cardiovascular:  Negative for chest pain and palpitations.  Gastrointestinal:  Negative for abdominal pain, blood in stool, constipation, heartburn and nausea.  Genitourinary:  Negative for dysuria and urgency.  Musculoskeletal:  Negative for joint pain and myalgias.  Neurological:  Negative for dizziness and headaches.  Endo/Heme/Allergies:  Does not bruise/bleed easily.  Psychiatric/Behavioral:  Negative for depression. The patient is not nervous/anxious and does not have insomnia.     No Known Allergies  Past Medical History:  Diagnosis Date   Cellulitis    Hypertension    Neck pain    cord compression   Weakness    numbness and tingling to both hands and related to neck    Past Surgical History:  Procedure Laterality Date   ANTERIOR CERVICAL DECOMP/DISCECTOMY FUSION N/A 09/23/2014   Procedure: ANTERIOR CERVICAL DECOMPRESSION/DISCECTOMY FUSION C4-C5, EXPLORATION OF FUSION C3-C4 C5-C6, REMOVAL OF HARDWARE C3-C4 C4-C5;  Surgeon: Venita Lick, MD;  Location: [facility] OR;  Service: Orthopedics;  Laterality: N/A;   COLONOSCOPY      INJECTION KNEE Right 07/2017   NECK SURGERY      Social History   Socioeconomic History   Marital status: Married    Spouse name: Not on file   Number of children: Not on file   Years of education: Not on file   Highest education level: Not on file  Occupational History   Not on file  Tobacco Use   Smoking status: Former    Packs/day: 0.50    Years: 15.00    Pack years: 7.50    Types: Cigarettes   Smokeless tobacco: Never  Substance and Sexual Activity   Alcohol use: No   Drug use: No   Sexual activity: Never  Other Topics Concern   Not on file  Social History Narrative   Not on file   Social Determinants of Health   Financial Resource Strain: Not on file  Food Insecurity: Not on file  Transportation Needs: Not on file  Physical Activity: Not on file  Stress: Not on file  Social Connections: Not on file    Family History  Family history unknown: Yes    Health Maintenance  Topic Date Due   OPHTHALMOLOGY EXAM  Never done   HIV Screening  Never done   Hepatitis C Screening  Never done   Zoster Vaccines- Shingrix (1 of 2) Never done   COVID-19 Vaccine (3 - Moderna risk series) 11/16/2019   HEMOGLOBIN A1C  09/02/2020   INFLUENZA VACCINE  Never done   FOOT EXAM  03/14/2022  TETANUS/TDAP  06/01/2024   COLONOSCOPY (Pts 45-51yrs Insurance coverage will need to be confirmed)  06/03/2028   HPV VACCINES  Aged Out     ----------------------------------------------------------------------------------------------------------------------------------------------------------------------------------------------------------------- Physical Exam BP 129/80 (BP Location: Left Arm, Patient Position: Sitting, Cuff Size: Large)   Pulse 88   Temp 97.7 F (36.5 C)   Ht 6' (1.829 m)   Wt 197 lb (89.4 kg)   SpO2 100%   BMI 26.72 kg/m   Physical Exam Constitutional:      General: He is not in acute distress. HENT:     Head: Normocephalic and atraumatic.     Right Ear:  Tympanic membrane and external ear normal.     Left Ear: Tympanic membrane and external ear normal.  Eyes:     General: No scleral icterus. Neck:     Thyroid: No thyromegaly.  Cardiovascular:     Rate and Rhythm: Normal rate and regular rhythm.     Heart sounds: Normal heart sounds.  Pulmonary:     Effort: Pulmonary effort is normal.     Breath sounds: Normal breath sounds.  Abdominal:     General: Bowel sounds are normal. There is no distension.     Palpations: Abdomen is soft.     Tenderness: There is no abdominal tenderness. There is no guarding.  Musculoskeletal:     Cervical back: Normal range of motion.  Lymphadenopathy:     Cervical: No cervical adenopathy.  Skin:    General: Skin is warm and dry.     Findings: No rash.  Neurological:     Mental Status: He is alert and oriented to person, place, and time.     Cranial Nerves: No cranial nerve deficit.     Motor: No abnormal muscle tone.  Psychiatric:        Mood and Affect: Mood normal.        Behavior: Behavior normal.    ------------------------------------------------------------------------------------------------------------------------------------------------------------------------------------------------------------------- Assessment and Plan  Hypertension goal BP (blood pressure) < 130/80 Pressure remains well controlled at this time.  Recommend continuation of losartan.  Type 2 diabetes mellitus with hyperglycemia (HCC) Updating A1c today.  He is not checking blood sugars at home.  Continue metformin for now.  Dyslipidemia, goal LDL below 100 Update lipid panel  Well adult exam Well adult Orders Placed This Encounter  Procedures   COMPLETE METABOLIC PANEL WITH GFR   CBC with Differential   Lipid Panel w/reflex Direct LDL   HgB A1c   TSH   PSA  Screening: PSA Immunization: Declines Shingrix and flu vaccine. Anticipatory guidance/risk factor reduction: Recommendations per AVS. so   Meds ordered  this encounter  Medications   losartan (COZAAR) 100 MG tablet    Sig: Take 1 tablet (100 mg total) by mouth daily.    Dispense:  90 tablet    Refill:  2   metFORMIN (GLUCOPHAGE) 500 MG tablet    Sig: TAKE 1 TABLET(500 MG) BY MOUTH 2 TIMES DAILY WITH A MEAL    Dispense:  180 tablet    Refill:  2    Return in about 6 months (around 09/11/2021) for HTN/DM.    This visit occurred during the SARS-CoV-2 public health emergency.  Safety protocols were in place, including screening questions prior to the visit, additional usage of staff PPE, and extensive cleaning of exam room while observing appropriate contact time as indicated for disinfecting solutions.

## 2021-03-14 NOTE — Assessment & Plan Note (Addendum)
Updating A1c today.  He is not checking blood sugars at home.  Continue metformin for now.

## 2021-03-14 NOTE — Assessment & Plan Note (Signed)
Well adult Orders Placed This Encounter  Procedures  . COMPLETE METABOLIC PANEL WITH GFR  . CBC with Differential  . Lipid Panel w/reflex Direct LDL  . HgB A1c  . TSH  . PSA  Screening: PSA Immunization: Declines Shingrix and flu vaccine. Anticipatory guidance/risk factor reduction: Recommendations per AVS.

## 2021-03-14 NOTE — Assessment & Plan Note (Signed)
Pressure remains well controlled at this time.  Recommend continuation of losartan.

## 2021-03-14 NOTE — Patient Instructions (Signed)
Preventive Care 60-60 Years Old, Male Preventive care refers to lifestyle choices and visits with your health care provider that can promote health and wellness. This includes: A yearly physical exam. This is also called an annual wellness visit. Regular dental and eye exams. Immunizations. Screening for certain conditions. Healthy lifestyle choices, such as: Eating a healthy diet. Getting regular exercise. Not using drugs or products that contain nicotine and tobacco. Limiting alcohol use. What can I expect for my preventive care visit? Physical exam Your health care provider will check your: Height and weight. These may be used to calculate your BMI (body mass index). BMI is a measurement that tells if you are at a healthy weight. Heart rate and blood pressure. Body temperature. Skin for abnormal spots. Counseling Your health care provider may ask you questions about your: Past medical problems. Family's medical history. Alcohol, tobacco, and drug use. Emotional well-being. Home life and relationship well-being. Sexual activity. Diet, exercise, and sleep habits. Work and work environment. Access to firearms. What immunizations do I need? Vaccines are usually given at various ages, according to a schedule. Your health care provider will recommend vaccines for you based on your age, medical history, and lifestyle or other factors, such as travel or where you work. What tests do I need? Blood tests Lipid and cholesterol levels. These may be checked every 5 years, or more often if you are over 60 years old. Hepatitis C test. Hepatitis B test. Screening Lung cancer screening. You may have this screening every year starting at age 60 if you have a 30-pack-year history of smoking and currently smoke or have quit within the past 15 years. Prostate cancer screening. Recommendations will vary depending on your family history and other risks. Genital exam to check for testicular cancer  or hernias. Colorectal cancer screening. All adults should have this screening starting at age 60 and continuing until age 75. Your health care provider may recommend screening at age 60 if you are at increased risk. You will have tests every 1-10 years, depending on your results and the type of screening test. Diabetes screening. This is done by checking your blood sugar (glucose) after you have not eaten for a while (fasting). You may have this done every 1-3 years. STD (sexually transmitted disease) testing, if you are at risk. Follow these instructions at home: Eating and drinking  Eat a diet that includes fresh fruits and vegetables, whole grains, lean protein, and low-fat dairy products. Take vitamin and mineral supplements as recommended by your health care provider. Do not drink alcohol if your health care provider tells you not to drink. If you drink alcohol: Limit how much you have to 0-2 drinks a day. Be aware of how much alcohol is in your drink. In the [place] one drink equals one 12 oz bottle of beer (355 mL), one 5 oz glass of wine (148 mL), or one 1 oz glass of hard liquor (44 mL). Lifestyle Take daily care of your teeth and gums. Brush your teeth every morning and night with fluoride toothpaste. Floss one time each day. Stay active. Exercise for at least 30 minutes 5 or more days each week. Do not use any products that contain nicotine or tobacco, such as cigarettes, e-cigarettes, and chewing tobacco. If you need help quitting, ask your health care provider. Do not use drugs. If you are sexually active, practice safe sex. Use a condom or other form of protection to prevent STIs (sexually transmitted infections). If told by your   health care provider, take low-dose aspirin daily starting at age 60. Find healthy ways to cope with stress, such as: Meditation, yoga, or listening to music. Journaling. Talking to a trusted person. Spending time with friends and  family. Safety Always wear your seat belt while driving or riding in a vehicle. Do not drive: If you have been drinking alcohol. Do not ride with someone who has been drinking. When you are tired or distracted. While texting. Wear a helmet and other protective equipment during sports activities. If you have firearms in your house, make sure you follow all gun safety procedures. What's next? Go to your health care provider once a year for an annual wellness visit. Ask your health care provider how often you should have your eyes and teeth checked. Stay up to date on all vaccines. This information is not intended to replace advice given to you by your health care provider. Make sure you discuss any questions you have with your health care provider. Document Revised: 08/20/2020 Document Reviewed: 06/06/2018 Elsevier Patient Education  2022 Elsevier Inc.   

## 2021-03-14 NOTE — Assessment & Plan Note (Signed)
Update lipid panel.  

## 2021-03-15 LAB — COMPLETE METABOLIC PANEL WITH GFR
AG Ratio: 1.3 (calc) (ref 1.0–2.5)
ALT: 15 U/L (ref 9–46)
AST: 15 U/L (ref 10–35)
Albumin: 4.5 g/dL (ref 3.6–5.1)
Alkaline phosphatase (APISO): 68 U/L (ref 35–144)
BUN: 16 mg/dL (ref 7–25)
CO2: 27 mmol/L (ref 20–32)
Calcium: 10 mg/dL (ref 8.6–10.3)
Chloride: 101 mmol/L (ref 98–110)
Creat: 0.94 mg/dL (ref 0.70–1.35)
Globulin: 3.6 g/dL (calc) (ref 1.9–3.7)
Glucose, Bld: 109 mg/dL — ABNORMAL HIGH (ref 65–99)
Potassium: 4.6 mmol/L (ref 3.5–5.3)
Sodium: 136 mmol/L (ref 135–146)
Total Bilirubin: 0.5 mg/dL (ref 0.2–1.2)
Total Protein: 8.1 g/dL (ref 6.1–8.1)
eGFR: 93 mL/min/{1.73_m2} (ref >=60)

## 2021-03-15 LAB — CBC WITH DIFFERENTIAL/PLATELET
Absolute Monocytes: 355 cells/uL (ref 200–950)
Basophils Absolute: 10 cells/uL (ref 0–200)
Basophils Relative: 0.2 %
Eosinophils Absolute: 40 cells/uL (ref 15–500)
Eosinophils Relative: 0.8 %
HCT: 46.1 % (ref 38.5–50.0)
Hemoglobin: 15 g/dL (ref 13.2–17.1)
Lymphs Abs: 2185 cells/uL (ref 850–3900)
MCH: 29 pg (ref 27.0–33.0)
MCHC: 32.5 g/dL (ref 32.0–36.0)
MCV: 89.2 fL (ref 80.0–100.0)
MPV: 12.9 fL — ABNORMAL HIGH (ref 7.5–12.5)
Monocytes Relative: 7.1 %
Neutro Abs: 2410 cells/uL (ref 1500–7800)
Neutrophils Relative %: 48.2 %
Platelets: 167 10*3/uL (ref 140–400)
RBC: 5.17 10*6/uL (ref 4.20–5.80)
RDW: 13.9 % (ref 11.0–15.0)
Total Lymphocyte: 43.7 %
WBC: 5 10*3/uL (ref 3.8–10.8)

## 2021-03-15 LAB — LIPID PANEL W/REFLEX DIRECT LDL
Cholesterol: 229 mg/dL — ABNORMAL HIGH (ref <200)
HDL: 54 mg/dL (ref >=40)
LDL Cholesterol (Calc): 154 mg/dL (calc) — ABNORMAL HIGH
Non-HDL Cholesterol (Calc): 175 mg/dL (calc) — ABNORMAL HIGH (ref <130)
Total CHOL/HDL Ratio: 4.2 (calc) (ref <5.0)
Triglycerides: 100 mg/dL (ref <150)

## 2021-03-15 LAB — HEMOGLOBIN A1C
Hgb A1c MFr Bld: 7.7 % of total Hgb — ABNORMAL HIGH (ref <5.7)
Mean Plasma Glucose: 174 mg/dL
eAG (mmol/L): 9.7 mmol/L

## 2021-03-15 LAB — TSH: TSH: 1.66 mIU/L (ref 0.40–4.50)

## 2021-03-25 ENCOUNTER — Telehealth: Payer: Self-pay

## 2021-03-25 NOTE — Telephone Encounter (Signed)
Pt lvm asking if his results had been mailed to his home address.   Returned pt's call. Advised we had not spoken of the results being mailed previously but I would be happy to do so.   Mailed lab results.

## 2021-03-26 ENCOUNTER — Emergency Department (HOSPITAL_COMMUNITY)
Admission: EM | Admit: 2021-03-26 | Discharge: 2021-03-26 | Payer: Commercial Managed Care - PPO | Attending: Emergency Medicine | Admitting: Emergency Medicine

## 2021-03-26 ENCOUNTER — Other Ambulatory Visit: Payer: Self-pay

## 2021-03-26 ENCOUNTER — Encounter (HOSPITAL_COMMUNITY): Payer: Self-pay | Admitting: Emergency Medicine

## 2021-03-26 DIAGNOSIS — Z79899 Other long term (current) drug therapy: Secondary | ICD-10-CM | POA: Insufficient documentation

## 2021-03-26 DIAGNOSIS — I10 Essential (primary) hypertension: Secondary | ICD-10-CM | POA: Insufficient documentation

## 2021-03-26 DIAGNOSIS — Z7984 Long term (current) use of oral hypoglycemic drugs: Secondary | ICD-10-CM | POA: Diagnosis not present

## 2021-03-26 DIAGNOSIS — H5711 Ocular pain, right eye: Secondary | ICD-10-CM | POA: Diagnosis present

## 2021-03-26 DIAGNOSIS — Z87891 Personal history of nicotine dependence: Secondary | ICD-10-CM | POA: Diagnosis not present

## 2021-03-26 DIAGNOSIS — E119 Type 2 diabetes mellitus without complications: Secondary | ICD-10-CM | POA: Diagnosis not present

## 2021-03-26 DIAGNOSIS — H16001 Unspecified corneal ulcer, right eye: Secondary | ICD-10-CM | POA: Insufficient documentation

## 2021-03-26 HISTORY — DX: Hyperlipidemia, unspecified: E78.5

## 2021-03-26 HISTORY — DX: Type 2 diabetes mellitus without complications: E11.9

## 2021-03-26 NOTE — ED Triage Notes (Signed)
Patient reports R eye redness, pain and green-yellow discharge x2 days. Reports vision is hazy due to discharge over that eye as well.

## 2021-03-26 NOTE — ED Notes (Addendum)
Attempted 20 ft visual acuity, L eye (w/ contact) was 20/200, R eye was unable to assess at all. Hand held visual acuity was 20/100 in L eye, unable to assess in R eye.

## 2021-03-26 NOTE — ED Notes (Signed)
Nicholos Johns Consulting civil engineer at [facility] ED given report on pt.

## 2021-03-26 NOTE — Discharge Instructions (Signed)
Drive directly to the [facility]. Do not stop on the way. Do not eat or drink until you have been evaluated at [facility].

## 2021-03-26 NOTE — ED Provider Notes (Signed)
[facility] Provider Note   CSN: [id number] Arrival date & time: 03/26/21  1448     History No chief complaint on file.   Donald Rowland is a 60 y.o. male presenting for evaluation of right eye pain and drainage.  Patient states on Wednesday, 4 days ago, he was loading stuff in his truck when he suddenly felt like there is some grit or sand in his eye.  No trauma to the eye.  Since then, he has had gradually worsening pain.  He also reports discharge/drainage from the eye since then.  No associated photophobia.  He does wear contacts, occasionally sleeps with them in.  He has a history of a left corneal eye transplant status post pseudomonal ulcer at [facility].  He denies fevers.  Does report a history of prediabetes, on metformin.  He denies photophobia.  No pain behind the eye.  He has used saline drops, has not applied anything else to the eye.  He has not followed up with his optometrist.  HPI     Past Medical History:  Diagnosis Date   Cellulitis    Hypertension    Neck pain    cord compression   Weakness    numbness and tingling to both hands and related to neck    Patient Active Problem List   Diagnosis Date Noted   Type 2 diabetes mellitus without complication (HCC) 03/14/2021   Raised prostate specific antigen 03/14/2021   Type 2 diabetes mellitus with hyperglycemia (HCC) 04/06/2020   Well adult exam 03/07/2020   Encounter for medication monitoring 08/20/2017   Primary osteoarthritis of right knee 08/20/2017   Osteoarthritis of multiple joints 06/01/2017   Dissecting cellulitis of scalp 05/28/2017   Chest pain    Hypertension goal BP (blood pressure) < 130/80    Chronic neck pain 09/23/2014   Dyslipidemia, goal LDL below 100 09/21/2014   Chronic obstructive pulmonary disease (HCC) 09/19/2014   History of adenomatous polyp of colon 05/01/2013   Post corneal transplant 04/07/2013   Former tobacco use 03/11/2013    Past Surgical  History:  Procedure Laterality Date   ANTERIOR CERVICAL DECOMP/DISCECTOMY FUSION N/A 09/23/2014   Procedure: ANTERIOR CERVICAL DECOMPRESSION/DISCECTOMY FUSION C4-C5, EXPLORATION OF FUSION C3-C4 C5-C6, REMOVAL OF HARDWARE C3-C4 C4-C5;  Surgeon: Venita Lick, MD;  Location: [facility] OR;  Service: Orthopedics;  Laterality: N/A;   COLONOSCOPY     INJECTION KNEE Right 07/2017   NECK SURGERY         Family History  Family history unknown: Yes    Social History   Tobacco Use   Smoking status: Former    Packs/day: 0.50    Years: 15.00    Pack years: 7.50    Types: Cigarettes   Smokeless tobacco: Never  Substance Use Topics   Alcohol use: No   Drug use: No    Home Medications Prior to Admission medications   Medication Sig Start Date End Date Taking? Authorizing Provider  losartan (COZAAR) 100 MG tablet Take 1 tablet (100 mg total) by mouth daily. 03/14/21   Everrett Coombe, DO  metFORMIN (GLUCOPHAGE) 500 MG tablet TAKE 1 TABLET(500 MG) BY MOUTH 2 TIMES DAILY WITH A MEAL 03/14/21   Everrett Coombe, DO  Multiple Vitamins-Minerals (MULTIVITAMIN WITH MINERALS) tablet Take 2 tablets by mouth daily.     [provider]    Allergies    Patient has no known allergies.  Review of Systems   Review of Systems  Eyes:  Positive  for pain, discharge, redness and visual disturbance.  All other systems reviewed and are negative.  Physical Exam Updated Vital Signs BP (!) 153/92 (BP Location: Left Arm)   Pulse 80   Temp 98.3 F (36.8 C) (Oral)   Resp 17   Ht 6' (1.829 m)   Wt 92.1 kg   SpO2 98%   BMI 27.53 kg/m   Physical Exam Vitals and nursing note reviewed.  Constitutional:      General: He is not in acute distress.    Appearance: Normal appearance.  HENT:     Head: Normocephalic and atraumatic.  Eyes:     Conjunctiva/sclera:     Right eye: Right conjunctiva is injected.     Pupils: Pupils are equal, round, and reactive to light.     Slit lamp exam:    Right eye: Corneal  ulcer present.     Comments: See picture below.  What appears to be a corneal ulcer present on the right eye.  Associated injection of the sclera/conjunctive.  EOMI.  Unable to assess pupillary response due to discharge.  Mild surrounding periorbital edema. Left eye without injection, swelling, or pain.  Cardiovascular:     Rate and Rhythm: Normal rate and regular rhythm.     Pulses: Normal pulses.  Pulmonary:     Effort: Pulmonary effort is normal. No respiratory distress.     Breath sounds: Normal breath sounds. No wheezing.     Comments: Speaking in full sentences.  Clear lung sounds in all fields. Abdominal:     General: There is no distension.     Palpations: Abdomen is soft.     Tenderness: There is no abdominal tenderness.  Musculoskeletal:        General: Normal range of motion.     Cervical back: Normal range of motion and neck supple.  Skin:    General: Skin is warm and dry.     Capillary Refill: Capillary refill takes less than 2 seconds.  Neurological:     Mental Status: He is alert and oriented to person, place, and time.  Psychiatric:        Mood and Affect: Mood and affect normal.        Speech: Speech normal.        Behavior: Behavior normal.       ED Results / Procedures / Treatments   Labs (all labs ordered are listed, but only abnormal results are displayed) Labs Reviewed - No data to display  EKG None  Radiology No results found.  Procedures Procedures   Medications Ordered in ED Medications - No data to display  ED Course  I have reviewed the triage vital signs and the nursing notes.  Pertinent labs & imaging results that were available during my care of the patient were reviewed by me and considered in my medical decision making (see chart for details).  Clinical Course as of 03/26/21 1919  Sat Mar 26, 2021  [id number] 60 year old male with prior history of corneal transplant on the left here with a few days of decreased vision and drainage in  his right eye.  No known trauma.  On exam has hypopyon and concern for endophthalmitis.  Will need consult to ophthalmology and likely transfer. [MB]    Clinical Course User Index [MB] Terrilee Files, MD   MDM Rules/Calculators/A&P  Patient presenting for evaluation of eye infection.  On exam, patient has a large ulcer of the right eye.  Will consult with ophthalmology.  Discussed with Dr. Vanessa Barbara from ophthalmology who reviewed the photos in the chart.  He feels, especially considering patient's history, that patient should be evaluated at [facility], where he has had previous corneal surgery.  Discussed findings and plan with patient and wife, who are agreeable.  Discussed with Dr. Kate Sable from [facility] ophthalmology, who agrees for pt to be trasnferred to [facility] for further evaluation. Accepting doctor [facility].   Final Clinical Impression(s) / ED Diagnoses Final diagnoses:  Corneal ulcer of right eye    Rx / DC Orders ED Discharge Orders     None        Alveria Apley, PA-C 03/26/21 1920    Terrilee Files, MD 03/27/21 [phone]

## 2021-03-28 ENCOUNTER — Telehealth: Payer: Self-pay | Admitting: General Practice

## 2021-03-28 NOTE — Telephone Encounter (Signed)
Transition Care Management Follow-up Telephone Call Date of discharge and from where: 03/27/21 from [facility] How have you been since you were released from the hospital? Overall doing ok. He is going back to see the Ophthalmologist today.  Any questions or concerns? No  Items Reviewed: Did the pt receive and understand the discharge instructions provided? Yes  Medications obtained and verified? Yes  Other? No  Any new allergies since your discharge? No  Dietary orders reviewed? Yes Do you have support at home? Yes   Home Care and Equipment/Supplies: Were home health services ordered? no  Functional Questionnaire: (I = Independent and D = Dependent) ADLs: I  Bathing/Dressing- I  Meal Prep- I  Eating- I  Maintaining continence- I  Transferring/Ambulation- I  Managing Meds- I  Follow up appointments reviewed:  PCP Hospital f/u appt confirmed? No   Specialist Hospital f/u appt confirmed? Yes  Scheduled to see [facility] on 03/28/21. Are transportation arrangements needed? No  If their condition worsens, is the pt aware to call PCP or go to the Emergency Dept.? Yes Was the patient provided with contact information for the PCP's office or ED? Yes Was to pt encouraged to call back with questions or concerns? Yes

## 2021-06-28 ENCOUNTER — Other Ambulatory Visit: Payer: Self-pay

## 2021-06-28 ENCOUNTER — Encounter (HOSPITAL_BASED_OUTPATIENT_CLINIC_OR_DEPARTMENT_OTHER): Payer: Self-pay | Admitting: *Deleted

## 2021-06-28 DIAGNOSIS — Y92410 Unspecified street and highway as the place of occurrence of the external cause: Secondary | ICD-10-CM | POA: Diagnosis not present

## 2021-06-28 DIAGNOSIS — M542 Cervicalgia: Secondary | ICD-10-CM | POA: Insufficient documentation

## 2021-06-28 DIAGNOSIS — M79641 Pain in right hand: Secondary | ICD-10-CM | POA: Diagnosis not present

## 2021-06-28 DIAGNOSIS — M25561 Pain in right knee: Secondary | ICD-10-CM | POA: Diagnosis not present

## 2021-06-28 DIAGNOSIS — M545 Low back pain, unspecified: Secondary | ICD-10-CM | POA: Insufficient documentation
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
## 2021-06-29 ENCOUNTER — Emergency Department (HOSPITAL_BASED_OUTPATIENT_CLINIC_OR_DEPARTMENT_OTHER)
Admission: EM | Admit: 2021-06-29 | Discharge: 2021-06-29 | Discharge status: Against Medical Advice | Payer: Self-pay | Attending: Emergency Medicine | Admitting: Emergency Medicine

## 2021-06-29 ENCOUNTER — Telehealth: Payer: Self-pay | Admitting: General Practice

## 2021-06-29 ENCOUNTER — Emergency Department (HOSPITAL_BASED_OUTPATIENT_CLINIC_OR_DEPARTMENT_OTHER): Payer: Self-pay

## 2021-06-29 MED ORDER — CYCLOBENZAPRINE HCL 10 MG PO TABS: 10.0000 mg | ORAL_TABLET | Freq: Two times a day (BID) | ORAL | 0 refills | Status: AC | PRN

## 2021-06-29 MED ORDER — CYCLOBENZAPRINE HCL 10 MG PO TABS: 10.0000 mg | ORAL_TABLET | Freq: Once | ORAL | Status: DC

## 2021-06-29 MED ORDER — IBUPROFEN 400 MG PO TABS: 600.0000 mg | ORAL_TABLET | Freq: Once | ORAL | Status: DC
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
## 2021-06-29 NOTE — ED Notes (Signed)
Patient encouraged to stay but insisted on leaving prior to CT scan resulting.  Dr. Ralene Bathe notified.

## 2021-06-29 NOTE — Telephone Encounter (Signed)
Transition Care Management Follow-up Telephone Call Date of discharge and from where: 06/29/21 How have you been since you were released from the hospital? Doing ok. Any questions or concerns? No  Items Reviewed: Did the pt receive and understand the discharge instructions provided? Yes  Medications obtained and verified? No  Other? No  Any new allergies since your discharge? No  Dietary orders reviewed? Yes Do you have support at home? Yes   Home Care and Equipment/Supplies: Were home health services ordered? no  Functional Questionnaire: (I = Independent and D = Dependent) ADLs: I  Bathing/Dressing- I  Meal Prep- I  Eating- I  Maintaining continence- I  Transferring/Ambulation- I  Managing Meds- I  Follow up appointments reviewed:  PCP Hospital f/u appt confirmed? No   Specialist Hospital f/u appt confirmed? No   Are transportation arrangements needed? No  If their condition worsens, is the pt aware to call PCP or go to the Emergency Dept.? Yes Was the patient provided with contact information for the PCP's office or ED? Yes Was to pt encouraged to call back with questions or concerns? Yes
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
